# Patient Record
Sex: Female | Born: 1970 | Race: Black or African American | Hispanic: No | Marital: Single | Smoking: Never smoker
Health system: Southern US, Community
[De-identification: ages and names within clinical notes are randomized; demographics above are authoritative.]

## PROBLEM LIST (undated history)

## (undated) DIAGNOSIS — D573 Sickle-cell trait: Secondary | ICD-10-CM

## (undated) HISTORY — DX: Sickle-cell trait: D57.3

## (undated) HISTORY — PX: TYMPANOSTOMY TUBE PLACEMENT: SHX32

## (undated) HISTORY — PX: TUBAL LIGATION: SHX77

## (undated) HISTORY — PX: HYSTERECTOMY ABDOMINAL WITH SALPINGECTOMY: SHX6725

## (undated) HISTORY — PX: ADENOIDECTOMY: SUR15

## (undated) HISTORY — PX: SINOSCOPY: SHX187

---

## 2015-11-26 DIAGNOSIS — Z800 Family history of malignant neoplasm of digestive organs: Secondary | ICD-10-CM | POA: Insufficient documentation

## 2015-11-26 DIAGNOSIS — D509 Iron deficiency anemia, unspecified: Secondary | ICD-10-CM

## 2015-11-26 HISTORY — DX: Family history of malignant neoplasm of digestive organs: Z80.0

## 2015-11-26 HISTORY — DX: Iron deficiency anemia, unspecified: D50.9

## 2015-12-02 DIAGNOSIS — D259 Leiomyoma of uterus, unspecified: Secondary | ICD-10-CM

## 2015-12-02 DIAGNOSIS — I10 Essential (primary) hypertension: Secondary | ICD-10-CM

## 2015-12-02 HISTORY — DX: Essential (primary) hypertension: I10

## 2015-12-02 HISTORY — DX: Leiomyoma of uterus, unspecified: D25.9

## 2018-07-29 ENCOUNTER — Encounter: Payer: Self-pay | Admitting: Family Medicine

## 2018-07-29 ENCOUNTER — Ambulatory Visit
Admission: RE | Admit: 2018-07-29 | Discharge: 2018-07-29 | Disposition: A | Discharge status: Home / Self Care | Payer: BLUE CROSS/BLUE SHIELD | Source: Ambulatory Visit | Attending: Family Medicine | Admitting: Family Medicine

## 2018-07-29 ENCOUNTER — Ambulatory Visit (INDEPENDENT_AMBULATORY_CARE_PROVIDER_SITE_OTHER): Payer: BLUE CROSS/BLUE SHIELD | Admitting: Family Medicine

## 2018-07-29 VITALS — BP 130/90 | HR 72 | Temp 98.6°F | Wt 365.4 lb

## 2018-07-29 DIAGNOSIS — M25561 Pain in right knee: Secondary | ICD-10-CM

## 2018-07-29 DIAGNOSIS — Z7689 Persons encountering health services in other specified circumstances: Secondary | ICD-10-CM | POA: Diagnosis not present

## 2018-07-29 MED ORDER — MELOXICAM 7.5 MG PO TABS: 7.5000 mg | ORAL_TABLET | Freq: Every day | ORAL | 0 refills | Status: DC

## 2018-07-29 NOTE — Progress Notes (Signed)
   Subjective:    Patient ID: Cheryl Morrow, female    DOB: 03-Feb-1971, 47 y.o.   MRN: [id number]  HPI Chief Complaint  Patient presents with  . knee pain    right knee pain,- 2 weeks ago. worse in the morning and then will get better throughout the day   She is new to the practice and here for an acute complaint. Previous medical care: moved here from [place] in March.  Other providers: PCP in [place]- Dr. Willow Ora   Complains of a 2-week history of right knee pain. No known injury. No surgery or history of similar knee pain.  Pain is worse in the morning to anterior and lateral aspect and pain is also worse with standing and with stairs.  Pain is improved with rest, heat or ice, elevation.  Notices swelling worsens during the day and improves with rest.   She has been taking ibuprofen 600 mg 3 times per day for the past 2 weeks.   Reports feeling  like her knee gives away and occasionally feels like it "locks up".  States she has tingling occasionally in posterior knee.   Denies fever, chills, chest pain, shortness of breath, N/V/D. No numbness, weakness. No other arthralgias or myalgias.   Morbid obesity.  Single. One child age 42. Works in [employer]. 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    Review of Systems Pertinent positives and negatives in the history of present illness.     Objective:   Physical Exam  Constitutional: She appears well-developed and well-nourished. No distress.  Cardiovascular: Intact distal pulses.  No LE edema  Musculoskeletal:       Right knee: She exhibits swelling. She exhibits normal range of motion, no deformity, no erythema and normal alignment. Tenderness found. Lateral joint line tenderness noted.  Difficult to do full exam due to body habitus but no obvious McMurrays sign or instability.    Skin: Skin is warm and dry. Capillary refill takes less than 2 seconds. No rash noted.   BP 130/90   Pulse 72   Temp 98.6  F (37 C) (Oral)   Wt (!) 365 lb 6.4 oz (165.7 kg)       Assessment & Plan:  Acute pain of right knee - Plan: meloxicam (MOBIC) 7.5 MG tablet, DG Knee Complete 4 Views Right, Ambulatory referral to Orthopedic Surgery  Encounter to establish care  Right lower extremity is neurovascularly intact.  Discussed possible etiologies such as arthritis, meniscal tear.  No instability on exam.  Recommend supportive care.  She will stop ibuprofen and start meloxicam.  I also recommend continued use of ice or heat and elevate when sitting.  She may also try topical analgesia such as Capsaicin cream. X-ray ordered and referral to orthopedist

## 2018-07-29 NOTE — Patient Instructions (Signed)
Stop the ibuprofen and try Meloxicam with food once daily.   Use ice or heat, elevate your knee when sitting.  You may try over the counter topical pain medicine such as Capsaicin cream.   We will call with your XR result.   I am referring you to the orthopedist and you should receive a call to schedule an appointment.

## 2018-08-05 ENCOUNTER — Ambulatory Visit (INDEPENDENT_AMBULATORY_CARE_PROVIDER_SITE_OTHER): Payer: BLUE CROSS/BLUE SHIELD | Admitting: Orthopaedic Surgery

## 2018-08-05 ENCOUNTER — Encounter (INDEPENDENT_AMBULATORY_CARE_PROVIDER_SITE_OTHER): Payer: Self-pay | Admitting: Orthopaedic Surgery

## 2018-08-05 VITALS — Ht 68.0 in | Wt 364.0 lb

## 2018-08-05 DIAGNOSIS — Z6841 Body Mass Index (BMI) 40.0 and over, adult: Secondary | ICD-10-CM

## 2018-08-05 DIAGNOSIS — M1711 Unilateral primary osteoarthritis, right knee: Secondary | ICD-10-CM | POA: Diagnosis not present

## 2018-08-05 MED ORDER — LIDOCAINE HCL 1 % IJ SOLN
2.0000 mL | INTRAMUSCULAR | Status: AC | PRN
  Administered 2018-08-05: 2 mL

## 2018-08-05 MED ORDER — BUPIVACAINE HCL 0.5 % IJ SOLN
2.0000 mL | INTRAMUSCULAR | Status: AC | PRN
  Administered 2018-08-05: 2 mL via INTRA_ARTICULAR

## 2018-08-05 MED ORDER — METHYLPREDNISOLONE ACETATE 40 MG/ML IJ SUSP
40.0000 mg | INTRAMUSCULAR | Status: AC | PRN
  Administered 2018-08-05: 40 mg via INTRA_ARTICULAR

## 2018-08-05 NOTE — Progress Notes (Signed)
Office Visit Note   Patient: Cheryl Morrow           Date of Birth: [birth year]           MRN: [id number] Visit Date: 08/05/2018              Requested by: Girtha Rm, NP-C [place], [place] [address] PCP: Girtha Rm, NP-C   Assessment & Plan: Visit Diagnoses:  1. Unilateral primary osteoarthritis, right knee   2. Body mass index 50.0-59.9, adult ([place])   3. Morbid obesity ([place])     Plan: Impression is 47 year old female with age advanced tricompartment degenerative joint disease.  We discussed the importance of weight loss and exercise for relief of knee pain and arthritis.  Cortisone injection performed today.  I gave her a sample of Pennsaid and a pamphlet for Synvisc.  Referral to Dr. Leafy Ro for weight loss management.  Questions encouraged and answered.  Follow-up as needed. Total face to face encounter time was greater than 45 minutes and over half of this time was spent in counseling and/or coordination of care. The patient meets the AMA guidelines for Morbid (severe) obesity with a BMI > 40.0 and I have recommended weight loss.  Follow-Up Instructions: Return if symptoms worsen or fail to improve.   Orders:  Orders Placed This Encounter  Procedures  . Amb Ref to Medical Weight Management   No orders of the defined types were placed in this encounter.     Procedures: Large Joint Inj: R knee on 08/05/2018 10:17 AM Indications: pain Details: 22 G needle  Arthrogram: No  Medications: 40 mg methylPREDNISolone acetate 40 MG/ML; 2 mL lidocaine 1 %; 2 mL bupivacaine 0.5 % Outcome: tolerated well, no immediate complications Consent was given by the patient. Patient was prepped and draped in the usual sterile fashion.       Clinical Data: No additional findings.   Subjective: Chief Complaint  Patient presents with  . Right Knee - Pain    Cheryl Morrow is a healthy 47 year old female comes in with 1 month of right knee pain with  progressive worsening.  She denies any injuries.  She has been referred here by her PCP.  She denies any mechanical symptoms or numbness and tingling.  Her pain is 7 out of 10 and worse in the morning.   Review of Systems  Constitutional: Negative.   HENT: Negative.   Eyes: Negative.   Respiratory: Negative.   Cardiovascular: Negative.   Endocrine: Negative.   Musculoskeletal: Negative.   Neurological: Negative.   Hematological: Negative.   Psychiatric/Behavioral: Negative.   All other systems reviewed and are negative.    Objective: Vital Signs: Ht 5\' 8"  (1.727 m)   Wt (!) 364 lb (165.1 kg)   BMI 55.35 kg/m   Physical Exam  Constitutional: She is oriented to person, place, and time. She appears well-developed and well-nourished.  HENT:  Head: Normocephalic and atraumatic.  Eyes: EOM are normal.  Neck: Neck supple.  Pulmonary/Chest: Effort normal.  Abdominal: Soft.  Neurological: She is alert and oriented to person, place, and time.  Skin: Skin is warm. Capillary refill takes less than 2 seconds.  Psychiatric: She has a normal mood and affect. Her behavior is normal. Judgment and thought content normal.  Nursing note and vitals reviewed.   Ortho Exam Right knee exam shows no joint effusion although this was somewhat difficult due to her soft tissue envelope.  Collaterals and cruciates are grossly intact.  Normal  range of motion. Specialty Comments:  No specialty comments available.  Imaging: No results found.   PMFS History: There are no active problems to display for this patient.  History reviewed. No pertinent past medical history.  History reviewed. No pertinent family history.  Past Surgical History:  Procedure Laterality Date  . HYSTERECTOMY ABDOMINAL WITH SALPINGECTOMY     Social History   Occupational History  . Not on file  Tobacco Use  . Smoking status: Never Smoker  . Smokeless tobacco: Never Used  Substance and Sexual Activity  . Alcohol  use: Yes    Comment: occasionally  . Drug use: Never  . Sexual activity: Not on file

## 2018-08-09 ENCOUNTER — Telehealth (INDEPENDENT_AMBULATORY_CARE_PROVIDER_SITE_OTHER): Payer: Self-pay | Admitting: Orthopaedic Surgery

## 2018-08-09 NOTE — Telephone Encounter (Signed)
Please advise. Thanks.  

## 2018-08-09 NOTE — Telephone Encounter (Signed)
Tramadol #14.  1-2 tab daily prn pain.  It has only been 4 days since her injection

## 2018-08-09 NOTE — Telephone Encounter (Signed)
Patient called requesting for pain. States the cortisone shot didn't help. Nothing over the counter  Is helping her.  Please call patient to advise [phone]

## 2018-08-09 NOTE — Telephone Encounter (Signed)
See message below °

## 2018-08-10 ENCOUNTER — Other Ambulatory Visit (INDEPENDENT_AMBULATORY_CARE_PROVIDER_SITE_OTHER): Payer: Self-pay

## 2018-08-10 MED ORDER — TRAMADOL HCL 50 MG PO TABS: ORAL_TABLET | ORAL | 0 refills | Status: DC

## 2018-08-10 NOTE — Telephone Encounter (Signed)
Called patient to advise. Advised her Rx was called into her pharm.

## 2018-08-29 ENCOUNTER — Telehealth (INDEPENDENT_AMBULATORY_CARE_PROVIDER_SITE_OTHER): Payer: Self-pay

## 2018-08-29 NOTE — Telephone Encounter (Signed)
See message below  Please advise

## 2018-08-29 NOTE — Telephone Encounter (Signed)
The patient calls with 2 requests: #1 a knee brace - she said she has a large leg so she might need an Rx for this, #2 would like something for pain.  She has Tramadol from her last ov - only been taking 1 qd because she only received #14 and she's trying to make it last.  She also takes Meloxicam 7.5 mg (originally given by PCP), but only has a couple left.  She says the Tramadol is not helping her pain well at night (however, she has been taking it during the day to get her through her work day). The pain wakes her at night and she has pain when she first gets up in the morning.  [facility] on [address].  Please advise on the brace and pain medicine. CB [phone].

## 2018-09-01 ENCOUNTER — Other Ambulatory Visit (INDEPENDENT_AMBULATORY_CARE_PROVIDER_SITE_OTHER): Payer: Self-pay

## 2018-09-01 MED ORDER — TRAMADOL HCL 50 MG PO TABS: 50.0000 mg | ORAL_TABLET | Freq: Two times a day (BID) | ORAL | 0 refills | Status: DC

## 2018-09-01 NOTE — Telephone Encounter (Signed)
I don't think djd unloading brace would be appropriate as she has tricompartmental changes.  She can get knee sleeve over the counter if she would like.  Ok to take two tramadol per day and ok to refill this.  Cannot give anything stronger.  Best to make all efforts at weight loss

## 2018-09-01 NOTE — Telephone Encounter (Signed)
Tried to call patient no answer LMOM. Just need to advise on message below. Also called in tramadol into pharm.

## 2018-09-05 NOTE — Telephone Encounter (Signed)
Tried calling again, no answer. LMVM

## 2018-10-12 ENCOUNTER — Telehealth (INDEPENDENT_AMBULATORY_CARE_PROVIDER_SITE_OTHER): Payer: Self-pay | Admitting: Orthopaedic Surgery

## 2018-10-12 NOTE — Telephone Encounter (Signed)
We cannot prescribe opiate pain meds on a chronic basis.  I'm happy to give her a refill of tramadol if she wants that.

## 2018-10-12 NOTE — Telephone Encounter (Signed)
Please advise 

## 2018-10-12 NOTE — Telephone Encounter (Signed)
Patient called to request an RX refill on her hydrocodone.  CB#[phone].  Thank you.

## 2018-10-13 NOTE — Telephone Encounter (Signed)
Called patient no answer LMOM. See message below. If she does want Rx for Tramadol please ask what pharm she would like [place] to send it in

## 2018-10-14 NOTE — Telephone Encounter (Signed)
Called patient no answer.

## 2019-09-13 ENCOUNTER — Encounter: Payer: Self-pay | Admitting: Family Medicine

## 2019-09-13 ENCOUNTER — Other Ambulatory Visit: Payer: Self-pay

## 2019-09-13 ENCOUNTER — Ambulatory Visit (INDEPENDENT_AMBULATORY_CARE_PROVIDER_SITE_OTHER): Payer: BC Managed Care – PPO | Admitting: Family Medicine

## 2019-09-13 VITALS — BP 184/118 | Resp 16 | Wt 355.0 lb

## 2019-09-13 DIAGNOSIS — R05 Cough: Secondary | ICD-10-CM | POA: Diagnosis not present

## 2019-09-13 DIAGNOSIS — I10 Essential (primary) hypertension: Secondary | ICD-10-CM | POA: Diagnosis not present

## 2019-09-13 DIAGNOSIS — Z9109 Other allergy status, other than to drugs and biological substances: Secondary | ICD-10-CM

## 2019-09-13 DIAGNOSIS — R058 Other specified cough: Secondary | ICD-10-CM

## 2019-09-13 NOTE — Progress Notes (Signed)
   Subjective:  Documentation for virtual audio and video telecommunications through [facility] encounter:  The patient was located at work. 2 patient identifiers used.  The provider was located in the office. The patient did consent to this visit and is aware of possible charges through their insurance for this visit.  The other persons participating in this telemedicine service were none.    Patient ID: Cheryl Morrow, female    DOB: [birth year], 48 y.o.   MRN: [id number]  HPI Chief Complaint  Patient presents with  . cough and sob    cough and sob, been going on for a couple monthd   She is fairly new to me and has several new concerns today.  Complains of dry cough since she moved to [place] from [place] in March 2019. States her cough is daytime and nighttime. She thought her cough was related to allergies so she is taking a non drowsy antihistamine but no relief.  She also reports having itchy, watery eyes and post nasal drainage.   Denies fever, chills, headache, dizziness, chest pain, palpitations, abdominal pain, N/V/D.   She also reports elevated BP for the past few weeks with a history of HTN. She has been on medication in the distant past but not sure what she took.  States her BP today is 184/118.   States swelling in her feet has been intermittent and resolves every morning. She sits at work most of the day. No orthopnea.   States she has noticed some shortness of breath with activity and she thinks this is related to being deconditioned.   Reviewed allergies, medications, past medical, surgical, family, and social history  Review of Systems Pertinent positives and negatives in the history of present illness.     Objective:   Physical Exam BP (!) 184/118   Resp 16   Wt (!) 355 lb (161 kg)   BMI 53.98 kg/m   Alert and oriented and in no acute distress. Respirations unlabored. Normal speech, mood.       Assessment & Plan:  Cough present for greater than 3 weeks  Environmental allergies  Uncontrolled hypertension  Discussed limitations of virtual visit and that her symptoms really do require an in office evaluation. I am concerned that her BP is severely elevated. She reports shortness of breath with exertion and I advised her that this could be a cardiac issue. She denies any chest pain or palpitations.  Recommend that she come in for evaluation and she will schedule a visit.  Strict precautions that if she has any neurological issues, chest pain or worsening shortness of breath that she be seen immediately.  Discuss allergies and cough at her visit.  She will most likely need HTN medication based on her reported BP today.   Time spent on call was 14 minutes and in review of previous records 15 minutes total.  This virtual service is not related to other E/M service within previous 7 days.

## 2019-09-15 ENCOUNTER — Ambulatory Visit
Admission: RE | Admit: 2019-09-15 | Discharge: 2019-09-15 | Disposition: A | Discharge status: Home / Self Care | Payer: BC Managed Care – PPO | Source: Ambulatory Visit | Attending: Family Medicine | Admitting: Family Medicine

## 2019-09-15 ENCOUNTER — Encounter: Payer: Self-pay | Admitting: Family Medicine

## 2019-09-15 ENCOUNTER — Ambulatory Visit (INDEPENDENT_AMBULATORY_CARE_PROVIDER_SITE_OTHER): Payer: BC Managed Care – PPO | Admitting: Family Medicine

## 2019-09-15 ENCOUNTER — Other Ambulatory Visit: Payer: Self-pay

## 2019-09-15 VITALS — BP 190/110 | HR 80 | Temp 98.8°F | Resp 18 | Wt 365.4 lb

## 2019-09-15 DIAGNOSIS — Z13220 Encounter for screening for lipoid disorders: Secondary | ICD-10-CM

## 2019-09-15 DIAGNOSIS — R05 Cough: Secondary | ICD-10-CM | POA: Diagnosis not present

## 2019-09-15 DIAGNOSIS — R0602 Shortness of breath: Secondary | ICD-10-CM

## 2019-09-15 DIAGNOSIS — I517 Cardiomegaly: Secondary | ICD-10-CM

## 2019-09-15 DIAGNOSIS — R058 Other specified cough: Secondary | ICD-10-CM

## 2019-09-15 DIAGNOSIS — R9431 Abnormal electrocardiogram [ECG] [EKG]: Secondary | ICD-10-CM | POA: Insufficient documentation

## 2019-09-15 DIAGNOSIS — I10 Essential (primary) hypertension: Secondary | ICD-10-CM

## 2019-09-15 DIAGNOSIS — Z131 Encounter for screening for diabetes mellitus: Secondary | ICD-10-CM

## 2019-09-15 HISTORY — DX: Cardiomegaly: I51.7

## 2019-09-15 HISTORY — DX: Essential (primary) hypertension: I10

## 2019-09-15 HISTORY — DX: Abnormal electrocardiogram (ECG) (EKG): R94.31

## 2019-09-15 HISTORY — DX: Morbid (severe) obesity due to excess calories: E66.01

## 2019-09-15 LAB — POCT URINALYSIS DIP (PROADVANTAGE DEVICE)
Bilirubin, UA: NEGATIVE
Blood, UA: NEGATIVE
Glucose, UA: NEGATIVE mg/dL
Ketones, POC UA: NEGATIVE mg/dL
Leukocytes, UA: NEGATIVE
Nitrite, UA: NEGATIVE
Specific Gravity, Urine: 1.01
Urobilinogen, Ur: NEGATIVE
pH, UA: 7.5 (ref 5.0–8.0)

## 2019-09-15 MED ORDER — LISINOPRIL-HYDROCHLOROTHIAZIDE 20-12.5 MG PO TABS: 1.0000 | ORAL_TABLET | Freq: Every day | ORAL | 2 refills | Status: DC

## 2019-09-15 NOTE — Patient Instructions (Signed)
Go to [facility] for your chest X ray as discussed.   Start on the medication to lower your blood pressure.  This is once daily dosing.   Keep a close eye on your BP.   Goal BP is 130/80 or less and the goal is to gradually get you to this reading.   Cut back on sodium and a good diet to follow is listed below.   I would like for you to be evaluated by a cardiologist as discussed due to abnormal findings on your EKG today.   If you experience chest pain, palpitations, worsening shortness of breath or any other worrisome symptoms, do not wait but call 911 or go straight to the emergency department.   We will be in tough with your results from today.      DASH Eating Plan DASH stands for "Dietary Approaches to Stop Hypertension." The DASH eating plan is a healthy eating plan that has been shown to reduce high blood pressure (hypertension). It may also reduce your risk for type 2 diabetes, heart disease, and stroke. The DASH eating plan may also help with weight loss. What are tips for following this plan?  General guidelines  Avoid eating more than 2,300 mg (milligrams) of salt (sodium) a day. If you have hypertension, you may need to reduce your sodium intake to 1,500 mg a day.  Limit alcohol intake to no more than 1 drink a day for nonpregnant women and 2 drinks a day for men. One drink equals 12 oz of beer, 5 oz of wine, or 1 oz of hard liquor.  Work with your health care provider to maintain a healthy body weight or to lose weight. Ask what an ideal weight is for you.  Get at least 30 minutes of exercise that causes your heart to beat faster (aerobic exercise) most days of the week. Activities may include walking, swimming, or biking.  Work with your health care provider or diet and nutrition specialist (dietitian) to adjust your eating plan to your individual calorie needs. Reading food labels   Check food labels for the amount of sodium per serving. Choose foods with  less than 5 percent of the Daily Value of sodium. Generally, foods with less than 300 mg of sodium per serving fit into this eating plan.  To find whole grains, look for the word "whole" as the first word in the ingredient list. Shopping  Buy products labeled as "low-sodium" or "no salt added."  Buy fresh foods. Avoid canned foods and premade or frozen meals. Cooking  Avoid adding salt when cooking. Use salt-free seasonings or herbs instead of table salt or sea salt. Check with your health care provider or pharmacist before using salt substitutes.  Do not fry foods. Cook foods using healthy methods such as baking, boiling, grilling, and broiling instead.  Cook with heart-healthy oils, such as olive, canola, soybean, or sunflower oil. Meal planning  Eat a balanced diet that includes: ? 5 or more servings of fruits and vegetables each day. At each meal, try to fill half of your plate with fruits and vegetables. ? Up to 6-8 servings of whole grains each day. ? Less than 6 oz of lean meat, poultry, or fish each day. A 3-oz serving of meat is about the same size as a deck of cards. One egg equals 1 oz. ? 2 servings of low-fat dairy each day. ? A serving of nuts, seeds, or beans 5 times each week. ? Heart-healthy fats. Healthy  fats called Omega-3 fatty acids are found in foods such as flaxseeds and coldwater fish, like sardines, salmon, and mackerel.  Limit how much you eat of the following: ? Canned or prepackaged foods. ? Food that is high in trans fat, such as fried foods. ? Food that is high in saturated fat, such as fatty meat. ? Sweets, desserts, sugary drinks, and other foods with added sugar. ? Full-fat dairy products.  Do not salt foods before eating.  Try to eat at least 2 vegetarian meals each week.  Eat more home-cooked food and less restaurant, buffet, and fast food.  When eating at a restaurant, ask that your food be prepared with less salt or no salt, if possible. What  foods are recommended? The items listed may not be a complete list. Talk with your dietitian about what dietary choices are best for you. Grains Whole-grain or whole-wheat bread. Whole-grain or whole-wheat pasta. Brown rice. [place] Morrow. Bulgur. Whole-grain and low-sodium cereals. Pita bread. Low-fat, low-sodium crackers. Whole-wheat flour tortillas. Vegetables Fresh or frozen vegetables (raw, steamed, roasted, or grilled). Low-sodium or reduced-sodium tomato and vegetable juice. Low-sodium or reduced-sodium tomato sauce and tomato paste. Low-sodium or reduced-sodium canned vegetables. Fruits All fresh, dried, or frozen fruit. Canned fruit in natural juice (without added sugar). Meat and other protein foods Skinless chicken or [place]. Ground chicken or [place]. Pork with fat trimmed off. Fish and seafood. Egg whites. Dried beans, peas, or lentils. Unsalted nuts, nut butters, and seeds. Unsalted canned beans. Lean cuts of beef with fat trimmed off. Low-sodium, lean deli meat. Dairy Low-fat (1%) or fat-free (skim) milk. Fat-free, low-fat, or reduced-fat cheeses. Nonfat, low-sodium ricotta or cottage cheese. Low-fat or nonfat yogurt. Low-fat, low-sodium cheese. Fats and oils Soft margarine without trans fats. Vegetable oil. Low-fat, reduced-fat, or light mayonnaise and salad dressings (reduced-sodium). Canola, safflower, olive, soybean, and sunflower oils. Avocado. Seasoning and other foods Herbs. Spices. Seasoning mixes without salt. Unsalted popcorn and pretzels. Fat-free sweets. What foods are not recommended? The items listed may not be a complete list. Talk with your dietitian about what dietary choices are best for you. Grains Baked goods made with fat, such as croissants, muffins, or some breads. Dry pasta or rice meal packs. Vegetables Creamed or fried vegetables. Vegetables in a cheese sauce. Regular canned vegetables (not low-sodium or reduced-sodium). Regular canned tomato sauce and  paste (not low-sodium or reduced-sodium). Regular tomato and vegetable juice (not low-sodium or reduced-sodium). Angie Fava. Olives. Fruits Canned fruit in a light or heavy syrup. Fried fruit. Fruit in cream or butter sauce. Meat and other protein foods Fatty cuts of meat. Ribs. Fried meat. Berniece Salines. Sausage. Bologna and other processed lunch meats. Salami. Fatback. Hotdogs. Bratwurst. Salted nuts and seeds. Canned beans with added salt. Canned or smoked fish. Whole eggs or egg yolks. Chicken or [place] with skin. Dairy Whole or 2% milk, cream, and half-and-half. Whole or full-fat cream cheese. Whole-fat or sweetened yogurt. Full-fat cheese. Nondairy creamers. Whipped toppings. Processed cheese and cheese spreads. Fats and oils Butter. Stick margarine. Lard. Shortening. Ghee. Bacon fat. Tropical oils, such as coconut, palm kernel, or palm oil. Seasoning and other foods Salted popcorn and pretzels. Onion salt, garlic salt, seasoned salt, table salt, and sea salt. Worcestershire sauce. Tartar sauce. Barbecue sauce. Teriyaki sauce. Soy sauce, including reduced-sodium. Steak sauce. Canned and packaged gravies. Fish sauce. Oyster sauce. Cocktail sauce. Horseradish that you find on the shelf. Ketchup. Mustard. Meat flavorings and tenderizers. Bouillon cubes. Hot sauce and Tabasco sauce. Premade or packaged  marinades. Premade or packaged taco seasonings. Relishes. Regular salad dressings. Where to find more information:  National Heart, Lung, and [place]: https://wilson-eaton.com/  American Heart Association: www.heart.org Summary  The DASH eating plan is a healthy eating plan that has been shown to reduce high blood pressure (hypertension). It may also reduce your risk for type 2 diabetes, heart disease, and stroke.  With the DASH eating plan, you should limit salt (sodium) intake to 2,300 mg a day. If you have hypertension, you may need to reduce your sodium intake to 1,500 mg a day.  When on the DASH  eating plan, aim to eat more fresh fruits and vegetables, whole grains, lean proteins, low-fat dairy, and heart-healthy fats.  Work with your health care provider or diet and nutrition specialist (dietitian) to adjust your eating plan to your individual calorie needs. This information is not intended to replace advice given to you by your health care provider. Make sure you discuss any questions you have with your health care provider. Document Released: 10/08/2011 Document Revised: 10/01/2017 Document Reviewed: 10/12/2016 Elsevier Patient Education  2020 [employer].

## 2019-09-15 NOTE — Progress Notes (Signed)
Subjective:    Patient ID: Cheryl Morrow, female    DOB: 1971-03-28, 48 y.o.   MRN: [id number]  HPI Chief Complaint  Patient presents with  . 1 week follow-up    1 week follow-up on bp, 182/ 102   Here with concerns related to elevated BP, chronic cough and shortness of breath that she thinks is related to her coughing spells.   Reports history of HTN and was on medication for BP from 2017 to 2018. No medication since 2018.   States she had a take medication to lower her blood pressure before she could have her hysterectomy which was in 2018.  States when her refills ran out she stopped taking medication and did not follow-up with her provider. She recently started checking her blood pressure and has seen at gradually increase  Denies chest pain, palpitations, orthopnea.  Reports intermittent lower extremity edema.  Denies having any swelling today.  Complains of dry cough since she moved to [place] from [place] in March 2019. States her cough is daytime and nighttime. She thought her cough was related to allergies so she is taking a non drowsy antihistamine but no relief.   She does not smoke. Denies family history of heart disease.  Denies fever, chills, headache, dizziness, unexplained weight changes , vision changes, abdominal pain, N/V/D, urinary symptoms.   Reviewed allergies, medications, past medical, surgical, family, and social history.    Review of Systems Pertinent positives and negatives in the history of present illness.     Objective:   Physical Exam Constitutional:      General: She is not in acute distress.    Appearance: She is obese. She is not ill-appearing.  Eyes:     Extraocular Movements: Extraocular movements intact.     Conjunctiva/sclera: Conjunctivae normal.     Pupils: Pupils are equal, round, and reactive to light.  Neck:     Musculoskeletal: Normal range of motion and neck supple.  Cardiovascular:     Rate and Rhythm: Normal rate and regular  rhythm.     Pulses: Normal pulses.     Heart sounds: Normal heart sounds.  Pulmonary:     Effort: Pulmonary effort is normal.     Comments: Difficulty to assess lung sounds in lower fields due to body habitus.  Musculoskeletal:     Right lower leg: No edema.     Left lower leg: No edema.  Skin:    General: Skin is warm and dry.     Capillary Refill: Capillary refill takes less than 2 seconds.  Neurological:     General: No focal deficit present.     Mental Status: She is alert and oriented to person, place, and time.     Cranial Nerves: No cranial nerve deficit.     Sensory: No sensory deficit.     Deep Tendon Reflexes: Reflexes normal.  Psychiatric:        Mood and Affect: Mood normal.        Thought Content: Thought content normal.    BP (!) 190/110   Pulse 80   Temp 98.8 F (37.1 C)   Resp 18   Wt (!) 365 lb 6.4 oz (165.7 kg)   SpO2 96%   BMI 55.56 kg/m       Assessment & Plan:  Uncontrolled hypertension - Plan: CBC with Differential, Comprehensive metabolic panel, POCT Urinalysis DIP (Proadvantage Device), Lipid Panel, EKG 12-Lead, DG Chest 2 View, Brain natriuretic peptide, Ambulatory referral to Cardiology, lisinopril-hydrochlorothiazide (  ZESTORETIC) 20-12.5 MG tablet  Cough present for greater than 3 weeks - Plan: CBC with Differential, Comprehensive metabolic panel, Brain natriuretic peptide  Abnormal ECG - Plan: Brain natriuretic peptide, Ambulatory referral to Cardiology  Shortness of breath - Plan: EKG 12-Lead, DG Chest 2 View, Brain natriuretic peptide, Ambulatory referral to Cardiology  Morbid obesity ([place]) - Plan: T4, Free, TSH, Lipid Panel  Screening for diabetes mellitus - Plan: Hemoglobin A1c  Screening for lipid disorders - Plan: Lipid Panel  She is not in any acute distress. ECG shows NSR, rate 76, poor R wave progression, T wave abnormalities with no acute S-T changes.  No old ECGs for comparison.  Discussed risk factors for heart disease  including uncontrolled HTN, morbid obesity. I am screening her for diabetes and lipid disorders today. Denies family history of heart disease.  Urinalysis shows a trace of protein otherwise negative. She will go to [facility] for a chest x-ray. Start on lisinopril/HCTZ and continue keeping a close eye on her blood pressure. Encouraged a low-sodium diet and Dash diet handout was given to her. Referral to cardiology and she has an appointment scheduled for next Tuesday. Strict precautions that if she develops chest pain, palpitations or worsening shortness of breath that she will call 911 or go to the closest emergency department.

## 2019-09-16 LAB — CBC WITH DIFFERENTIAL/PLATELET
Basophils Absolute: 0.1 10*3/uL (ref 0.0–0.2)
Basos: 1 %
EOS (ABSOLUTE): 0 10*3/uL (ref 0.0–0.4)
Eos: 1 %
Hematocrit: 40.5 % (ref 34.0–46.6)
Hemoglobin: 13.2 g/dL (ref 11.1–15.9)
Immature Grans (Abs): 0 10*3/uL (ref 0.0–0.1)
Immature Granulocytes: 0 %
Lymphocytes Absolute: 1.8 10*3/uL (ref 0.7–3.1)
Lymphs: 36 %
MCH: 27.5 pg (ref 26.6–33.0)
MCHC: 32.6 g/dL (ref 31.5–35.7)
MCV: 84 fL (ref 79–97)
Monocytes Absolute: 0.5 10*3/uL (ref 0.1–0.9)
Monocytes: 10 %
Neutrophils Absolute: 2.6 10*3/uL (ref 1.4–7.0)
Neutrophils: 52 %
Platelets: 204 10*3/uL (ref 150–450)
RBC: 4.8 x10E6/uL (ref 3.77–5.28)
RDW: 13.1 % (ref 11.7–15.4)
WBC: 5 10*3/uL (ref 3.4–10.8)

## 2019-09-16 LAB — COMPREHENSIVE METABOLIC PANEL
ALT: 16 IU/L (ref 0–32)
AST: 20 IU/L (ref 0–40)
Albumin/Globulin Ratio: 1.1 — ABNORMAL LOW (ref 1.2–2.2)
Albumin: 4.1 g/dL (ref 3.8–4.8)
Alkaline Phosphatase: 102 IU/L (ref 39–117)
BUN/Creatinine Ratio: 10 (ref 9–23)
BUN: 11 mg/dL (ref 6–24)
Bilirubin Total: 0.4 mg/dL (ref 0.0–1.2)
CO2: 24 mmol/L (ref 20–29)
Calcium: 9.2 mg/dL (ref 8.7–10.2)
Chloride: 102 mmol/L (ref 96–106)
Creatinine, Ser: 1.1 mg/dL — ABNORMAL HIGH (ref 0.57–1.00)
GFR calc Af Amer: 70 mL/min/{1.73_m2} (ref >59)
GFR calc non Af Amer: 60 mL/min/{1.73_m2} (ref >59)
Globulin, Total: 3.7 g/dL (ref 1.5–4.5)
Glucose: 90 mg/dL (ref 65–99)
Potassium: 3.3 mmol/L — ABNORMAL LOW (ref 3.5–5.2)
Sodium: 140 mmol/L (ref 134–144)
Total Protein: 7.8 g/dL (ref 6.0–8.5)

## 2019-09-16 LAB — HEMOGLOBIN A1C
Est. average glucose Bld gHb Est-mCnc: 117 mg/dL
Hgb A1c MFr Bld: 5.7 % — ABNORMAL HIGH (ref 4.8–5.6)

## 2019-09-16 LAB — LIPID PANEL
Chol/HDL Ratio: 3.9 ratio (ref 0.0–4.4)
Cholesterol, Total: 200 mg/dL — ABNORMAL HIGH (ref 100–199)
HDL: 51 mg/dL (ref >39)
LDL Chol Calc (NIH): 131 mg/dL — ABNORMAL HIGH (ref 0–99)
Triglycerides: 98 mg/dL (ref 0–149)
VLDL Cholesterol Cal: 18 mg/dL (ref 5–40)

## 2019-09-16 LAB — BRAIN NATRIURETIC PEPTIDE: BNP: 54.7 pg/mL (ref 0.0–100.0)

## 2019-09-16 LAB — TSH: TSH: 0.644 u[IU]/mL (ref 0.450–4.500)

## 2019-09-16 LAB — T4, FREE: Free T4: 1.9 ng/dL — ABNORMAL HIGH (ref 0.82–1.77)

## 2019-09-17 ENCOUNTER — Encounter: Payer: Self-pay | Admitting: Family Medicine

## 2019-09-17 ENCOUNTER — Other Ambulatory Visit: Payer: Self-pay | Admitting: Family Medicine

## 2019-09-17 DIAGNOSIS — E876 Hypokalemia: Secondary | ICD-10-CM

## 2019-09-17 DIAGNOSIS — R7303 Prediabetes: Secondary | ICD-10-CM | POA: Insufficient documentation

## 2019-09-17 HISTORY — DX: Prediabetes: R73.03

## 2019-09-17 HISTORY — DX: Hypokalemia: E87.6

## 2019-09-17 MED ORDER — POTASSIUM CHLORIDE CRYS ER 20 MEQ PO TBCR: 20.0000 meq | EXTENDED_RELEASE_TABLET | Freq: Every day | ORAL | 0 refills | Status: DC

## 2019-09-18 ENCOUNTER — Ambulatory Visit: Payer: BC Managed Care – PPO | Admitting: Family Medicine

## 2019-09-19 ENCOUNTER — Encounter: Payer: Self-pay | Admitting: Cardiology

## 2019-09-19 ENCOUNTER — Other Ambulatory Visit: Payer: Self-pay

## 2019-09-19 ENCOUNTER — Ambulatory Visit (INDEPENDENT_AMBULATORY_CARE_PROVIDER_SITE_OTHER): Payer: BC Managed Care – PPO | Admitting: Cardiology

## 2019-09-19 VITALS — BP 148/90 | HR 93 | Ht 68.0 in | Wt 371.0 lb

## 2019-09-19 DIAGNOSIS — R079 Chest pain, unspecified: Secondary | ICD-10-CM

## 2019-09-19 DIAGNOSIS — E876 Hypokalemia: Secondary | ICD-10-CM

## 2019-09-19 DIAGNOSIS — R011 Cardiac murmur, unspecified: Secondary | ICD-10-CM

## 2019-09-19 DIAGNOSIS — R0789 Other chest pain: Secondary | ICD-10-CM

## 2019-09-19 DIAGNOSIS — R7303 Prediabetes: Secondary | ICD-10-CM | POA: Diagnosis not present

## 2019-09-19 DIAGNOSIS — I10 Essential (primary) hypertension: Secondary | ICD-10-CM | POA: Diagnosis not present

## 2019-09-19 HISTORY — DX: Other chest pain: R07.89

## 2019-09-19 HISTORY — DX: Cardiac murmur, unspecified: R01.1

## 2019-09-19 MED ORDER — AMLODIPINE BESYLATE 5 MG PO TABS: 5.0000 mg | ORAL_TABLET | Freq: Every day | ORAL | 3 refills | Status: DC

## 2019-09-19 NOTE — Progress Notes (Signed)
Cardiology Office Note:    Date:  09/19/2019   ID:  Cheryl Morrow, DOB September 22, 1971, MRN [id number]  PCP:  Girtha Rm, NP-C  Cardiologist:  Jenean Lindau, MD   Referring MD: Girtha Rm, NP-C    ASSESSMENT:    1. Chest discomfort   2. Chest pain, unspecified type   3. Uncontrolled hypertension   4. Prediabetes   5. Morbid obesity ([place])   6. Hypokalemia   7. Cardiac murmur    PLAN:    In order of problems listed above:  1. Chest discomfort: Patient has risk factors for coronary artery disease.  Primary prevention stressed.  Importance of compliance with diet and medication stressed and she vocalized understanding.  I told her to avoid strenuous activity.  She will undergo 2-day Lexiscan sestamibi to assess the symptoms.  In the interim if she has any significant symptoms she knows to go to the nearest emergency room. 2. Essential hypertension: Diet was discussed.  I have added amlodipine 5 mg daily to her regimen.  She will keep a track of her blood pressures at work. 3. Morbid obesity: Diet was discussed and risks of obesity explained and she vocalized understanding and she promises to better 4. Patient will be seen in follow-up appointment in 6 months or earlier if the patient has any concerns    Medication Adjustments/Labs and Tests Ordered: Current medicines are reviewed at length with the patient today.  Concerns regarding medicines are outlined above.  Orders Placed This Encounter  Procedures  . Basic Metabolic Panel (BMET)  . Magnesium  . Myocardial Perfusion Imaging  . ECHOCARDIOGRAM COMPLETE   Meds ordered this encounter  Medications  . amLODipine (NORVASC) 5 MG tablet    Sig: Take 1 tablet (5 mg total) by mouth daily.    Dispense:  30 tablet    Refill:  3     History of Present Illness:    Cheryl Morrow is a 48 y.o. female who is being seen today for the evaluation of essential hypertension and chest discomfort at the request of Henson,  Vickie L, NP-C.  Patient is a pleasant 48 year old female.  She has no significant past medical history.  She is morbidly obese.  She mentions to me that recently she was diagnosed to have essential hypertension and medications were instituted and her blood pressure is better.  Chest x-ray was abnormal and revealed the possibility of an enlarged heart therefore she was sent here for evaluation.  She mentions to me that she leads a sedentary lifestyle.  She does not exercise on a regular basis.  She is active at work.  She occasionally has chest discomfort.  I checked with her with her sexual activity brings about the symptoms and she answered in the positive.  She has a chest pressure-like sensation during sexual activity and at times it has made her stop.  No orthopnea or PND.  No radiation to the neck or to the arms.  At the time of my evaluation, the patient is alert awake oriented and in no distress.  Past Medical History:  Diagnosis Date  . EKG abnormalities 09/15/2019  . Hypokalemia 09/17/2019  . Mild cardiomegaly 09/15/2019  . Morbid obesity ([place]) 09/15/2019  . Prediabetes 09/17/2019  . Uncontrolled hypertension 09/15/2019    Past Surgical History:  Procedure Laterality Date  . HYSTERECTOMY ABDOMINAL WITH SALPINGECTOMY      Current Medications: Current Meds  Medication Sig  . lisinopril-hydrochlorothiazide (ZESTORETIC) 20-12.5 MG tablet Take 1  tablet by mouth daily.  . potassium chloride SA (KLOR-CON) 20 MEQ tablet Take 1 tablet (20 mEq total) by mouth daily.     Allergies:   Patient has no known allergies.   Social History   Socioeconomic History  . Marital status: Single    Spouse name: Not on file  . Number of children: Not on file  . Years of education: Not on file  . Highest education level: Not on file  Occupational History  . Not on file  Social Needs  . Financial resource strain: Not on file  . Food insecurity    Worry: Not on file    Inability: Not on file  .  Transportation needs    Medical: Not on file    Non-medical: Not on file  Tobacco Use  . Smoking status: Never Smoker  . Smokeless tobacco: Never Used  Substance and Sexual Activity  . Alcohol use: Yes    Comment: occasionally  . Drug use: Never  . Sexual activity: Not on file  Lifestyle  . Physical activity    Days per week: Not on file    Minutes per session: Not on file  . Stress: Not on file  Relationships  . Social Herbalist on phone: Not on file    Gets together: Not on file    Attends religious service: Not on file    Active member of club or organization: Not on file    Attends meetings of clubs or organizations: Not on file    Relationship status: Not on file  Other Topics Concern  . Not on file  Social History Narrative  . Not on file     Family History: The patient's family history is not on file.  ROS:   Please see the history of present illness.    All other systems reviewed and are negative.  EKGs/Labs/Other Studies Reviewed:    The following studies were reviewed today: EKG reveals sinus rhythm LVH and nonspecific ST-T changes.   Recent Labs: 09/15/2019: ALT 16; BNP 54.7; BUN 11; Creatinine, Ser 1.10; Hemoglobin 13.2; Platelets 204; Potassium 3.3; Sodium 140; TSH 0.644  Recent Lipid Panel    Component Value Date/Time   CHOL 200 (H) 09/15/2019 1015   TRIG 98 09/15/2019 1015   HDL 51 09/15/2019 1015   CHOLHDL 3.9 09/15/2019 1015   LDLCALC 131 (H) 09/15/2019 1015    Physical Exam:    VS:  BP (!) 148/90   Pulse 93   Ht 5\' 8"  (1.727 m)   Wt (!) 371 lb (168.3 kg)   SpO2 96%   BMI 56.41 kg/m     Wt Readings from Last 3 Encounters:  09/19/19 (!) 371 lb (168.3 kg)  09/15/19 (!) 365 lb 6.4 oz (165.7 kg)  09/13/19 (!) 355 lb (161 kg)     GEN: Patient is in no acute distress HEENT: Normal NECK: No JVD; No carotid bruits LYMPHATICS: No lymphadenopathy CARDIAC: S1 S2 regular, 2/6 systolic murmur at the apex. RESPIRATORY:  Clear  to auscultation without rales, wheezing or rhonchi  ABDOMEN: Soft, non-tender, non-distended MUSCULOSKELETAL:  No edema; No deformity  SKIN: Warm and dry NEUROLOGIC:  Alert and oriented x 3 PSYCHIATRIC:  Normal affect    Signed, Jenean Lindau, MD  09/19/2019 3:16 PM    [place]

## 2019-09-19 NOTE — Patient Instructions (Addendum)
Medication Instructions:    Your physician has recommended you make the following change in your medication: START taking amlodipine 5 mg (1 tablet ) once daily  If you need a refill on your cardiac medications before your next appointment, please call your pharmacy.   Lab work: NONE If you have labs (blood work) drawn today and your tests are completely normal, you will receive your results only by: Marland Kitchen MyChart Message (if you have MyChart) OR . A paper copy in the mail If you have any lab test that is abnormal or we need to change your treatment, we will call you to review the results.  Testing/Procedures: Your physician has requested that you have an echocardiogram. Echocardiography is a painless test that uses sound waves to create images of your heart. It provides your doctor with information about the size and shape of your heart and how well your heart's chambers and valves are working. This procedure takes approximately one hour. There are no restrictions for this procedure.  Your physician has requested that you have a lexiscan myoview. For further information please visit [email or web address]. Please follow instruction sheet, as given.    Follow-Up: At [facility], you and your health needs are our priority.  As part of our continuing mission to provide you with exceptional heart care, we have created designated Provider Care Teams.  These Care Teams include your primary Cardiologist (physician) and Advanced Practice Providers (APPs -  Physician Assistants and Nurse Practitioners) who all work together to provide you with the care you need, when you need it. You will need a follow up appointment in 3 months.   Any Other Special Instructions Will Be Listed Below  Regadenoson injection What is this medicine? REGADENOSON is used to test the heart for coronary artery disease. It is used in patients who can not exercise for their stress test. This medicine may be used for other  purposes; ask your health care provider or pharmacist if you have questions. COMMON BRAND NAME(S): Lexiscan What should I tell my health care provider before I take this medicine? They need to know if you have any of these conditions:  heart problems  lung or breathing disease, like asthma or COPD  an unusual or allergic reaction to regadenoson, other medicines, foods, dyes, or preservatives  pregnant or trying to get pregnant  breast-feeding How should I use this medicine? This medicine is for injection into a vein. It is given by a health care professional in a hospital or clinic setting. Talk to your pediatrician regarding the use of this medicine in children. Special care may be needed. Overdosage: If you think you have taken too much of this medicine contact a poison control center or emergency room at once. NOTE: This medicine is only for you. Do not share this medicine with others. What if I miss a dose? This does not apply. What may interact with this medicine?  caffeine  dipyridamole  guarana  theophylline This list may not describe all possible interactions. Give your health care provider a list of all the medicines, herbs, non-prescription drugs, or dietary supplements you use. Also tell them if you smoke, drink alcohol, or use illegal drugs. Some items may interact with your medicine. What should I watch for while using this medicine? Your condition will be monitored carefully while you are receiving this medicine. Do not take medicines, foods, or drinks with caffeine (like coffee, tea, or colas) for at least 12 hours before your test. If you do  not know if something contains caffeine, ask your health care professional. What side effects may I notice from receiving this medicine? Side effects that you should report to your doctor or health care professional as soon as possible:  allergic reactions like skin rash, itching or hives, swelling of the face, lips, or tongue   breathing problems  chest pain, tightness or palpitations  severe headache Side effects that usually do not require medical attention (report to your doctor or health care professional if they continue or are bothersome):  flushing  headache  irritation or pain at site where injected  nausea, vomiting This list may not describe all possible side effects. Call your doctor for medical advice about side effects. You may report side effects to FDA at 1-800-FDA-1088. Where should I keep my medicine? This drug is given in a hospital or clinic and will not be stored at home. NOTE: This sheet is a summary. It may not cover all possible information. If you have questions about this medicine, talk to your doctor, pharmacist, or health care provider.  2020 Elsevier/Gold Standard (2008-06-18 15:08:13)  Cardiac Nuclear Scan A cardiac nuclear scan is a test that is done to check the flow of blood to your heart. It is done when you are resting and when you are exercising. The test looks for problems such as:  Not enough blood reaching a portion of the heart.  The heart muscle not working as it should. You may need this test if:  You have heart disease.  You have had lab results that are not normal.  You have had heart surgery or a balloon procedure to open up blocked arteries (angioplasty).  You have chest pain.  You have shortness of breath. In this test, a special dye (tracer) is put into your bloodstream. The tracer will travel to your heart. A camera will then take pictures of your heart to see how the tracer moves through your heart. This test is usually done at a hospital and takes 2-4 hours. Tell a doctor about:  Any allergies you have.  All medicines you are taking, including vitamins, herbs, eye drops, creams, and over-the-counter medicines.  Any problems you or family members have had with anesthetic medicines.  Any blood disorders you have.  Any surgeries you have had.   Any medical conditions you have.  Whether you are pregnant or may be pregnant. What are the risks? Generally, this is a safe test. However, problems may occur, such as:  Serious chest pain and heart attack. This is only a risk if the stress portion of the test is done.  Rapid heartbeat.  A feeling of warmth in your chest. This feeling usually does not last long.  Allergic reaction to the tracer. What happens before the test?  Ask your doctor about changing or stopping your normal medicines. This is important.  Follow instructions from your doctor about what you cannot eat or drink.  Remove your jewelry on the day of the test. What happens during the test?  An IV tube will be inserted into one of your veins.  Your doctor will give you a small amount of tracer through the IV tube.  You will wait for 20-40 minutes while the tracer moves through your bloodstream.  Your heart will be monitored with an electrocardiogram (ECG).  You will lie down on an exam table.  Pictures of your heart will be taken for about 15-20 minutes.  You may also have a stress test. For  this test, one of these things may be done: ? You will be asked to exercise on a treadmill or a stationary bike. ? You will be given medicines that will make your heart work harder. This is done if you are unable to exercise.  When blood flow to your heart has peaked, a tracer will again be given through the IV tube.  After 20-40 minutes, you will get back on the exam table. More pictures will be taken of your heart.  Depending on the tracer that is used, more pictures may need to be taken 3-4 hours later.  Your IV tube will be removed when the test is over. The test may vary among doctors and hospitals. What happens after the test?  Ask your doctor: ? Whether you can return to your normal schedule, including diet, activities, and medicines. ? Whether you should drink more fluids. This will help to remove the  tracer from your body. Drink enough fluid to keep your pee (urine) pale yellow.  Ask your doctor, or the department that is doing the test: ? When will my results be ready? ? How will I get my results? Summary  A cardiac nuclear scan is a test that is done to check the flow of blood to your heart.  Tell your doctor whether you are pregnant or may be pregnant.  Before the test, ask your doctor about changing or stopping your normal medicines. This is important.  Ask your doctor whether you can return to your normal activities. You may be asked to drink more fluids. This information is not intended to replace advice given to you by your health care provider. Make sure you discuss any questions you have with your health care provider. Document Released: 04/04/2018 Document Revised: 02/08/2019 Document Reviewed: 04/04/2018 Elsevier Patient Education  [place].  Echocardiogram An echocardiogram is a procedure that uses painless sound waves (ultrasound) to produce an image of the heart. Images from an echocardiogram can provide important information about:  Signs of coronary artery disease (CAD).  Aneurysm detection. An aneurysm is a weak or damaged part of an artery wall that bulges out from the normal force of blood pumping through the body.  Heart size and shape. Changes in the size or shape of the heart can be associated with certain conditions, including heart failure, aneurysm, and CAD.  Heart muscle function.  Heart valve function.  Signs of a past heart attack.  Fluid buildup around the heart.  Thickening of the heart muscle.  A tumor or infectious growth around the heart valves. Tell a health care provider about:  Any allergies you have.  All medicines you are taking, including vitamins, herbs, eye drops, creams, and over-the-counter medicines.  Any blood disorders you have.  Any surgeries you have had.  Any medical conditions you have.  Whether you are  pregnant or may be pregnant. What are the risks? Generally, this is a safe procedure. However, problems may occur, including:  Allergic reaction to dye (contrast) that may be used during the procedure. What happens before the procedure? No specific preparation is needed. You may eat and drink normally. What happens during the procedure?   An IV tube may be inserted into one of your veins.  You may receive contrast through this tube. A contrast is an injection that improves the quality of the pictures from your heart.  A gel will be applied to your chest.  A wand-like tool (transducer) will be moved over your chest.  The gel will help to transmit the sound waves from the transducer.  The sound waves will harmlessly bounce off of your heart to allow the heart images to be captured in real-time motion. The images will be recorded on a computer. The procedure may vary among health care providers and hospitals. What happens after the procedure?  You may return to your normal, everyday life, including diet, activities, and medicines, unless your health care provider tells you not to do that. Summary  An echocardiogram is a procedure that uses painless sound waves (ultrasound) to produce an image of the heart.  Images from an echocardiogram can provide important information about the size and shape of your heart, heart muscle function, heart valve function, and fluid buildup around your heart.  You do not need to do anything to prepare before this procedure. You may eat and drink normally.  After the echocardiogram is completed, you may return to your normal, everyday life, unless your health care provider tells you not to do that. This information is not intended to replace advice given to you by your health care provider. Make sure you discuss any questions you have with your health care provider. Document Released: 10/16/2000 Document Revised: 02/09/2019 Document Reviewed: 11/21/2016  Elsevier Patient Education  [place].  Amlodipine tablets What is this medicine? AMLODIPINE (am LOE di peen) is a calcium-channel blocker. It affects the amount of calcium found in your heart and muscle cells. This relaxes your blood vessels, which can reduce the amount of work the heart has to do. This medicine is used to lower high blood pressure. It is also used to prevent chest pain. This medicine may be used for other purposes; ask your health care provider or pharmacist if you have questions. COMMON BRAND NAME(S): Norvasc What should I tell my health care provider before I take this medicine? They need to know if you have any of these conditions:  heart disease  liver disease  an unusual or allergic reaction to amlodipine, other medicines, foods, dyes, or preservatives  pregnant or trying to get pregnant  breast-feeding How should I use this medicine? Take this medicine by mouth with a glass of water. Follow the directions on the prescription label. You can take it with or without food. If it upsets your stomach, take it with food. Take your medicine at regular intervals. Do not take it more often than directed. Do not stop taking except on your doctor's advice. Talk to your pediatrician regarding the use of this medicine in children. While this drug may be prescribed for children as young as 6 years for selected conditions, precautions do apply. Patients over 31 years of age may have a stronger reaction and need a smaller dose. Overdosage: If you think you have taken too much of this medicine contact a poison control center or emergency room at once. NOTE: This medicine is only for you. Do not share this medicine with others. What if I miss a dose? If you miss a dose, take it as soon as you can. If it is almost time for your next dose, take only that dose. Do not take double or extra doses. What may interact with this medicine? Do not take this medicine with any of the  following medications:  tranylcypromine This medicine may also interact with the following medications:  clarithromycin  cyclosporine  diltiazem  itraconazole  simvastatin  tacrolimus This list may not describe all possible interactions. Give your health care provider a list  of all the medicines, herbs, non-prescription drugs, or dietary supplements you use. Also tell them if you smoke, drink alcohol, or use illegal drugs. Some items may interact with your medicine. What should I watch for while using this medicine? Visit your healthcare professional for regular checks on your progress. Check your blood pressure as directed. Ask your healthcare professional what your blood pressure should be and when you should contact him or her. Do not treat yourself for coughs, colds, or pain while you are using this medicine without asking your healthcare professional for advice. Some medicines may increase your blood pressure. You may get dizzy. Do not drive, use machinery, or do anything that needs mental alertness until you know how this medicine affects you. Do not stand or sit up quickly, especially if you are an older patient. This reduces the risk of dizzy or fainting spells. Avoid alcoholic drinks; they can make you dizzier. What side effects may I notice from receiving this medicine? Side effects that you should report to your doctor or health care professional as soon as possible:  allergic reactions like skin rash, itching or hives; swelling of the face, lips, or tongue  fast, irregular heartbeat  signs and symptoms of low blood pressure like dizziness; feeling faint or lightheaded, falls; unusually weak or tired  swelling of ankles, feet, hands Side effects that usually do not require medical attention (report these to your doctor or health care professional if they continue or are bothersome):  dry mouth  facial flushing  headache  stomach pain  tiredness This list may not  describe all possible side effects. Call your doctor for medical advice about side effects. You may report side effects to FDA at 1-800-FDA-1088. Where should I keep my medicine? Keep out of the reach of children. Store at room temperature between 59 and 86 degrees F (15 and 30 degrees C). Throw away any unused medicine after the expiration date. NOTE: This sheet is a summary. It may not cover all possible information. If you have questions about this medicine, talk to your doctor, pharmacist, or health care provider.  2020 Elsevier/Gold Standard (2018-05-13 15:07:10)

## 2019-09-20 LAB — BASIC METABOLIC PANEL
BUN/Creatinine Ratio: 13 (ref 9–23)
BUN: 15 mg/dL (ref 6–24)
CO2: 25 mmol/L (ref 20–29)
Calcium: 9.1 mg/dL (ref 8.7–10.2)
Chloride: 100 mmol/L (ref 96–106)
Creatinine, Ser: 1.13 mg/dL — ABNORMAL HIGH (ref 0.57–1.00)
GFR calc Af Amer: 67 mL/min/{1.73_m2} (ref >59)
GFR calc non Af Amer: 58 mL/min/{1.73_m2} — ABNORMAL LOW (ref >59)
Glucose: 90 mg/dL (ref 65–99)
Potassium: 3.8 mmol/L (ref 3.5–5.2)
Sodium: 140 mmol/L (ref 134–144)

## 2019-09-20 LAB — MAGNESIUM: Magnesium: 2.2 mg/dL (ref 1.6–2.3)

## 2019-09-21 ENCOUNTER — Encounter: Payer: Self-pay | Admitting: Family Medicine

## 2019-09-21 ENCOUNTER — Ambulatory Visit (INDEPENDENT_AMBULATORY_CARE_PROVIDER_SITE_OTHER): Payer: BC Managed Care – PPO | Admitting: Family Medicine

## 2019-09-21 ENCOUNTER — Other Ambulatory Visit: Payer: Self-pay

## 2019-09-21 VITALS — BP 140/90 | HR 83 | Temp 97.7°F | Wt 370.6 lb

## 2019-09-21 DIAGNOSIS — E7800 Pure hypercholesterolemia, unspecified: Secondary | ICD-10-CM

## 2019-09-21 DIAGNOSIS — E876 Hypokalemia: Secondary | ICD-10-CM

## 2019-09-21 DIAGNOSIS — R7989 Other specified abnormal findings of blood chemistry: Secondary | ICD-10-CM

## 2019-09-21 DIAGNOSIS — I10 Essential (primary) hypertension: Secondary | ICD-10-CM | POA: Diagnosis not present

## 2019-09-21 HISTORY — DX: Pure hypercholesterolemia, unspecified: E78.00

## 2019-09-21 HISTORY — DX: Other specified abnormal findings of blood chemistry: R79.89

## 2019-09-21 NOTE — Progress Notes (Signed)
   Subjective:    Patient ID: Cheryl Morrow, female    DOB: [birth year], 48 y.o.   MRN: [id number]  HPI Chief Complaint  Patient presents with  . follow-up    follow-up on labs   She is here for follow up on hypokalemia and HTN.  Her potassium was 3.3 so I started her on K-dur 4 days ago and plan to recheck her potassium today.  Her BP has improved some since starting on lisinopril-HCTZ started by me on 09/15/19 and with the addition of amlodipine prescribed by Dr. Geraldo Pitter on 09/19/19.  Has a stress test scheduled on December 2nd and 3rd.   She would like to lose weight and requests help with this.   Denies fever, chills, headache, dizziness, chest pain, palpitations, shortness of breath, abdominal pain, N/V/D, urinary symptoms, LE edema.    Reviewed allergies, medications, past medical, surgical, family, and social history.    Review of Systems Pertinent positives and negatives in the history of present illness.     Objective:   Physical Exam BP 140/90   Pulse 83   Temp 97.7 F (36.5 C)   Wt (!) 370 lb 9.6 oz (168.1 kg)   BMI 56.35 kg/m   Alert and oriented and in no acute distress. Not otherwise examined.       Assessment & Plan:  Uncontrolled hypertension - Plan: Amb Ref to Medical Weight Management  Hypokalemia - Plan: Basic Metabolic Panel  Morbid obesity ([place]) - Plan: Amb Ref to Medical Weight Management  Elevated LDL cholesterol level  Elevated serum creatinine  She is feeling well today. Reports good compliance with medications and no side effects. Has not started checking her BP at home yet. Encouraged her to do this.  She will hold off on K-dur supplement until I get her BMP result.  Referral to [facility]. She would benefit greatly from weight loss.  See cardiologist and have stress testing as scheduled.

## 2019-09-21 NOTE — Patient Instructions (Signed)
Continue on your current medications. Buy a BP machine and keep an eye on your BP at home.   GOAL BP is 130/80 or less. You will eventually get there and if not, please let me or your cardiologist know.   I am referring you to [facility] and they will call you.   Follow up with me in 6-8 weeks.

## 2019-09-22 LAB — BASIC METABOLIC PANEL
BUN/Creatinine Ratio: 14 (ref 9–23)
BUN: 14 mg/dL (ref 6–24)
CO2: 25 mmol/L (ref 20–29)
Calcium: 9.4 mg/dL (ref 8.7–10.2)
Chloride: 102 mmol/L (ref 96–106)
Creatinine, Ser: 1.02 mg/dL — ABNORMAL HIGH (ref 0.57–1.00)
GFR calc Af Amer: 76 mL/min/{1.73_m2} (ref >59)
GFR calc non Af Amer: 66 mL/min/{1.73_m2} (ref >59)
Glucose: 95 mg/dL (ref 65–99)
Potassium: 4.1 mmol/L (ref 3.5–5.2)
Sodium: 140 mmol/L (ref 134–144)

## 2019-09-26 ENCOUNTER — Telehealth (HOSPITAL_COMMUNITY): Payer: Self-pay

## 2019-09-26 NOTE — Telephone Encounter (Signed)
Instructions left on the patient's answering machine. Asked to call back with any questions. S.Kaelon Weekes EMTP 

## 2019-10-03 ENCOUNTER — Other Ambulatory Visit: Payer: Self-pay

## 2019-10-03 ENCOUNTER — Ambulatory Visit (HOSPITAL_COMMUNITY): Payer: BC Managed Care – PPO | Attending: Cardiovascular Disease

## 2019-10-03 VITALS — Ht 68.0 in | Wt 371.0 lb

## 2019-10-03 DIAGNOSIS — R079 Chest pain, unspecified: Secondary | ICD-10-CM | POA: Insufficient documentation

## 2019-10-03 DIAGNOSIS — R0789 Other chest pain: Secondary | ICD-10-CM | POA: Insufficient documentation

## 2019-10-03 MED ORDER — REGADENOSON 0.4 MG/5ML IV SOLN
0.4000 mg | Freq: Once | INTRAVENOUS | Status: AC
  Administered 2019-10-03: 0.4 mg via INTRAVENOUS

## 2019-10-03 MED ORDER — TECHNETIUM TC 99M TETROFOSMIN IV KIT
31.5000 | PACK | Freq: Once | INTRAVENOUS | Status: AC | PRN
  Administered 2019-10-03: 31.5 via INTRAVENOUS
  Filled 2019-10-03: qty 32

## 2019-10-04 ENCOUNTER — Other Ambulatory Visit: Payer: Self-pay

## 2019-10-04 ENCOUNTER — Ambulatory Visit (HOSPITAL_BASED_OUTPATIENT_CLINIC_OR_DEPARTMENT_OTHER): Payer: BC Managed Care – PPO

## 2019-10-04 ENCOUNTER — Ambulatory Visit (HOSPITAL_COMMUNITY): Payer: BC Managed Care – PPO | Attending: Internal Medicine

## 2019-10-04 DIAGNOSIS — R079 Chest pain, unspecified: Secondary | ICD-10-CM | POA: Diagnosis present

## 2019-10-04 LAB — MYOCARDIAL PERFUSION IMAGING
LV dias vol: 139 mL (ref 46–106)
LV sys vol: 73 mL
Peak HR: 98 {beats}/min
Rest HR: 82 {beats}/min
SDS: 3
SRS: 0
SSS: 3
TID: 0.88

## 2019-10-04 MED ORDER — TECHNETIUM TC 99M TETROFOSMIN IV KIT
31.5000 | PACK | Freq: Once | INTRAVENOUS | Status: AC | PRN
  Administered 2019-10-04: 31.5 via INTRAVENOUS
  Filled 2019-10-04: qty 32

## 2019-10-05 ENCOUNTER — Telehealth: Payer: Self-pay

## 2019-10-05 NOTE — Telephone Encounter (Signed)
-----   Message from Jenean Lindau, MD sent at 10/05/2019  9:42 AM EST ----- Needs appointment to discuss results. Jenean Lindau, MD 10/05/2019 9:42 AM

## 2019-10-05 NOTE — Telephone Encounter (Signed)
Results relayed, patient scheduled for f/u 10/24/2019 in [facility] office.

## 2019-10-11 ENCOUNTER — Telehealth: Payer: Self-pay | Admitting: Cardiology

## 2019-10-11 DIAGNOSIS — I10 Essential (primary) hypertension: Secondary | ICD-10-CM

## 2019-10-11 NOTE — Telephone Encounter (Signed)
New Message  Pt is calling and stating that the blood pressure medication isn't working as far as taking the fluid off  Please call.

## 2019-10-12 NOTE — Telephone Encounter (Signed)
Patient returned call, please call back  

## 2019-10-12 NOTE — Telephone Encounter (Signed)
She should try stopping l lisinopril hydrochlorothiazide.  She should start on the lowest dose of losartan hydrochlorothiazide.  Keep a track of blood pressures and come back for Chem-7 in a week.  Hopefully the cough will be better.  We will need blood pressure readings so we can adjust her medications.

## 2019-10-12 NOTE — Telephone Encounter (Signed)
Left message for patient to return call.

## 2019-10-12 NOTE — Telephone Encounter (Signed)
Pt called to report that her BP has been doing well.. it has been consistently 128/72 no HR to report.  But she continues to have persistent mild ankle edema and a dry hacking cough sometimes with clear sputum production. She has been experiencing this since Nov. She denies SOB, no chest pain... she would like to ask Dr. Geraldo Pitter if this could be med related or if she needs any other testing done to be sure she dos not have any other problems going on with her.   Will forward to Revankar for review.

## 2019-10-13 MED ORDER — LOSARTAN POTASSIUM-HCTZ 50-12.5 MG PO TABS: 1.0000 | ORAL_TABLET | Freq: Every day | ORAL | 1 refills | Status: DC

## 2019-10-13 NOTE — Telephone Encounter (Signed)
Patient was also advised to keep a log of her blood pressure readings and bring back to [place].

## 2019-10-13 NOTE — Addendum Note (Signed)
Addended by: Ashok Norris on: 10/13/2019 09:24 AM   Modules accepted: Orders

## 2019-10-13 NOTE — Telephone Encounter (Signed)
Called patient. Informed her that per Dr. Geraldo Pitter she should stop taking lisinopril hydrochlorothiazide and start taking losartan-hydrochlorothiazide 50-12.5 mg daily. She was also advised to return in 1 week and have labs redrawn. Patient verbally understood. No further questions.

## 2019-10-13 NOTE — Telephone Encounter (Signed)
Left message for patient to return call.

## 2019-10-24 ENCOUNTER — Ambulatory Visit (INDEPENDENT_AMBULATORY_CARE_PROVIDER_SITE_OTHER): Payer: BC Managed Care – PPO | Admitting: Cardiology

## 2019-10-24 ENCOUNTER — Telehealth: Payer: Self-pay | Admitting: Family Medicine

## 2019-10-24 ENCOUNTER — Other Ambulatory Visit: Payer: Self-pay

## 2019-10-24 ENCOUNTER — Encounter: Payer: Self-pay | Admitting: Cardiology

## 2019-10-24 VITALS — BP 140/98 | HR 86 | Ht 68.0 in | Wt 370.4 lb

## 2019-10-24 DIAGNOSIS — I10 Essential (primary) hypertension: Secondary | ICD-10-CM

## 2019-10-24 MED ORDER — AMLODIPINE BESYLATE 10 MG PO TABS: 10.0000 mg | ORAL_TABLET | Freq: Every day | ORAL | 2 refills | Status: DC

## 2019-10-24 NOTE — Telephone Encounter (Signed)
I recommend a virtual visit to discuss her chronic cough. It does not appear to be medication related nor her heart causing this. We have discussed allergies in the past as an underlying reason for cough and she reported trying an antihistamine without any improvement. Let's discuss this further to see if we can figure this out.

## 2019-10-24 NOTE — Telephone Encounter (Signed)
Pt was notified and has virtual tomorrow

## 2019-10-24 NOTE — Patient Instructions (Signed)
Medication Instructions:  Your physician has recommended you make the following change in your medication:  INCREASE amlodipine from 5mg  to 10 mg (1 tablet) once daily  *If you need a refill on your cardiac medications before your next appointment, please call your pharmacy*  Lab Work: NONE If you have labs (blood work) drawn today and your tests are completely normal, you will receive your results only by: Marland Kitchen MyChart Message (if you have MyChart) OR . A paper copy in the mail If you have any lab test that is abnormal or we need to change your treatment, we will call you to review the results.  Testing/Procedures: You had an EKG performed today  Follow-Up: At [facility], you and your health needs are our priority.  As part of our continuing mission to provide you with exceptional heart care, we have created designated Provider Care Teams.  These Care Teams include your primary Cardiologist (physician) and Advanced Practice Providers (APPs -  Physician Assistants and Nurse Practitioners) who all work together to provide you with the care you need, when you need it.  Your next appointment:   3 month(s)  The format for your next appointment:   In Person  Provider:   Jyl Heinz, MD

## 2019-10-24 NOTE — Progress Notes (Signed)
Cardiology Office Note:    Date:  10/24/2019   ID:  Cheryl Morrow, DOB October 15, 1971, MRN [id number]  PCP:  Girtha Rm, NP-C  Cardiologist:  Jenean Lindau, MD   Referring MD: Girtha Rm, NP-C    ASSESSMENT:    1. Uncontrolled hypertension   2. Morbid obesity ([place])    PLAN:    In order of problems listed above:  1. Essential hypertension: I discussed my findings with patient at length.  Diet was discussed.  I told her that she needs to see her primary care physician for her cough as her ARB is unlikely to give her cough and she promises to do so.  Salt intake issues were discussed.  I doubled amlodipine to 10 mg daily and she will keep a track of her blood pressures and send it to [place] in a week.  We will review and titrate medications as needed. 2. Mixed dyslipidemia: Diet was discussed for this 3. Morbid obesity: Diet was discussed.  Risks of obesity explained and she vocalized understanding.  She also plans to see a dietitian for this. 4. Patient will be seen in follow-up appointment in 3 months or earlier if the patient has any concerns    Medication Adjustments/Labs and Tests Ordered: Current medicines are reviewed at length with the patient today.  Concerns regarding medicines are outlined above.  Orders Placed This Encounter  Procedures  . EKG 12-Lead   Meds ordered this encounter  Medications  . amLODipine (NORVASC) 10 MG tablet    Sig: Take 1 tablet (10 mg total) by mouth daily.    Dispense:  90 tablet    Refill:  2     No chief complaint on file.    History of Present Illness:    Cheryl Morrow is a 48 y.o. female.  Patient was evaluated for EKG abnormalities.  She has essential hypertension and is morbidly obese.  She has mixed dyslipidemia.  She denies any problems at this time and takes care of activities of daily living.  No chest pain orthopnea or PND.  She still has some persistent cough with her ARB.  At the time of my evaluation, the patient  is alert awake oriented and in no distress.  Past Medical History:  Diagnosis Date  . EKG abnormalities 09/15/2019  . Hypokalemia 09/17/2019  . Mild cardiomegaly 09/15/2019  . Morbid obesity ([place]) 09/15/2019  . Prediabetes 09/17/2019  . Uncontrolled hypertension 09/15/2019    Past Surgical History:  Procedure Laterality Date  . HYSTERECTOMY ABDOMINAL WITH SALPINGECTOMY      Current Medications: Current Meds  Medication Sig  . amLODipine (NORVASC) 10 MG tablet Take 1 tablet (10 mg total) by mouth daily.  Marland Kitchen losartan-hydrochlorothiazide (HYZAAR) 50-12.5 MG tablet Take 1 tablet by mouth daily.  . [DISCONTINUED] amLODipine (NORVASC) 5 MG tablet Take 1 tablet (5 mg total) by mouth daily.     Allergies:   Patient has no known allergies.   Social History   Socioeconomic History  . Marital status: Single    Spouse name: Not on file  . Number of children: Not on file  . Years of education: Not on file  . Highest education level: Not on file  Occupational History  . Not on file  Tobacco Use  . Smoking status: Never Smoker  . Smokeless tobacco: Never Used  Substance and Sexual Activity  . Alcohol use: Yes    Comment: occasionally  . Drug use: Never  . Sexual activity: Not  on file  Other Topics Concern  . Not on file  Social History Narrative  . Not on file   Social Determinants of Health   Financial Resource Strain:   . Difficulty of Paying Living Expenses: Not on file  Food Insecurity:   . Worried About Charity fundraiser in the Last Year: Not on file  . Ran Out of Food in the Last Year: Not on file  Transportation Needs:   . Lack of Transportation (Medical): Not on file  . Lack of Transportation (Non-Medical): Not on file  Physical Activity:   . Days of Exercise per Week: Not on file  . Minutes of Exercise per Session: Not on file  Stress:   . Feeling of Stress : Not on file  Social Connections:   . Frequency of Communication with Friends and Family: Not on  file  . Frequency of Social Gatherings with Friends and Family: Not on file  . Attends Religious Services: Not on file  . Active Member of Clubs or Organizations: Not on file  . Attends Archivist Meetings: Not on file  . Marital Status: Not on file     Family History: The patient's family history is not on file.  ROS:   Please see the history of present illness.    All other systems reviewed and are negative.  EKGs/Labs/Other Studies Reviewed:    The following studies were reviewed today: Study Highlights    Nuclear stress EF: 48%.  No T wave inversion was noted during stress.  There was no ST segment deviation noted during stress.  This is an intermediate risk study.   Normal perfusion. LVEF 48%, dilated LV with mild global hypokinesis. Echo correlation recommended. This is an intermediate risk study due to decreased LV function.     IMPRESSIONS    1. Left ventricular ejection fraction, by visual estimation, is 50 to 55%. The left ventricle has low normal function. There is no left ventricular hypertrophy.  2. Inferior basal hypokinesis Abnormal GLS -13.1.  3. Global right ventricle has normal systolic function.The right ventricular size is normal. No increase in right ventricular wall thickness.  4. Left atrial size was mildly dilated.  5. Right atrial size was normal.  6. The mitral valve is normal in structure. Trace mitral valve regurgitation. No evidence of mitral stenosis.  7. The tricuspid valve is normal in structure. Tricuspid valve regurgitation is not demonstrated.  8. The aortic valve is tricuspid. Aortic valve regurgitation is not visualized. Mild aortic valve sclerosis without stenosis.  9. The pulmonic valve was normal in structure. Pulmonic valve regurgitation is not visualized. 10. Normal pulmonary artery systolic pressure. 11. The inferior vena cava is normal in size with greater than 50% respiratory variability, suggesting right atrial  pressure of 3 mmHg.    Recent Labs: 09/15/2019: ALT 16; BNP 54.7; Hemoglobin 13.2; Platelets 204; TSH 0.644 09/19/2019: Magnesium 2.2 09/21/2019: BUN 14; Creatinine, Ser 1.02; Potassium 4.1; Sodium 140  Recent Lipid Panel    Component Value Date/Time   CHOL 200 (H) 09/15/2019 1015   TRIG 98 09/15/2019 1015   HDL 51 09/15/2019 1015   CHOLHDL 3.9 09/15/2019 1015   LDLCALC 131 (H) 09/15/2019 1015    Physical Exam:    VS:  BP (!) 140/98   Pulse 86   Ht 5\' 8"  (1.727 m)   Wt (!) 370 lb 6.4 oz (168 kg)   SpO2 99%   BMI 56.32 kg/m     Wt Readings  from Last 3 Encounters:  10/24/19 (!) 370 lb 6.4 oz (168 kg)  10/03/19 (!) 371 lb (168.3 kg)  09/21/19 (!) 370 lb 9.6 oz (168.1 kg)     GEN: Patient is in no acute distress HEENT: Normal NECK: No JVD; No carotid bruits LYMPHATICS: No lymphadenopathy CARDIAC: Hear sounds regular, 2/6 systolic murmur at the apex. RESPIRATORY:  Clear to auscultation without rales, wheezing or rhonchi  ABDOMEN: Soft, non-tender, non-distended MUSCULOSKELETAL:  No edema; No deformity  SKIN: Warm and dry NEUROLOGIC:  Alert and oriented x 3 PSYCHIATRIC:  Normal affect   Signed, Jenean Lindau, MD  10/24/2019 8:41 AM    [place]

## 2019-10-24 NOTE — Telephone Encounter (Signed)
Pt called and stated that she was seen by a cardiologist for excessive coughing and they told her it was nothing they could do that she needs to be seen by an allergy specialist so pt will need a referral to see one.

## 2019-10-25 ENCOUNTER — Ambulatory Visit (INDEPENDENT_AMBULATORY_CARE_PROVIDER_SITE_OTHER): Payer: BC Managed Care – PPO | Admitting: Family Medicine

## 2019-10-25 ENCOUNTER — Encounter: Payer: Self-pay | Admitting: Family Medicine

## 2019-10-25 VITALS — Wt 370.0 lb

## 2019-10-25 DIAGNOSIS — R053 Chronic cough: Secondary | ICD-10-CM

## 2019-10-25 DIAGNOSIS — R05 Cough: Secondary | ICD-10-CM | POA: Diagnosis not present

## 2019-10-25 DIAGNOSIS — T7840XA Allergy, unspecified, initial encounter: Secondary | ICD-10-CM

## 2019-10-25 MED ORDER — FLUTICASONE PROPIONATE 50 MCG/ACT NA SUSP: 2.0000 | Freq: Every day | NASAL | 6 refills | Status: DC

## 2019-10-25 MED ORDER — FAMOTIDINE 20 MG PO TABS: 20.0000 mg | ORAL_TABLET | Freq: Two times a day (BID) | ORAL | 2 refills | Status: DC

## 2019-10-25 MED ORDER — LEVOCETIRIZINE DIHYDROCHLORIDE 5 MG PO TABS: 5.0000 mg | ORAL_TABLET | Freq: Every evening | ORAL | 2 refills | Status: DC

## 2019-10-25 NOTE — Progress Notes (Signed)
   Subjective:  Documentation for virtual audio and video telecommunications through [place] encounter:  The patient was located at work. 2 patient identifiers used.  The provider was located in the office. The patient did consent to this visit and is aware of possible charges through their insurance for this visit.  The other persons participating in this telemedicine service were none.    Patient ID: Cheryl Morrow, female    DOB: 1971-11-01, 48 y.o.   MRN: [id number]  HPI Chief Complaint  Patient presents with  . chronic cough    chronic cough and allergies  been going on a while   Complain of chronic dry cough.  States cough started after moving from [place] to [place] in March 2019.  Cough is during the day and occasionally wakes her up at night.  She thinks this is related allergies. She is not taking any medication currently. States she has tried several allergy medications in the past. Also c/o itchy, watery eyes, rhinorrhea and nasal congestion at times.   Denies hx of reflux or asthma.  Does not smoke.  She has been seeing her cardiologist and states her BP has improved.  States he was switched from lisinopril to losartan and this did not improve her cough.   Denies fever, chills, chest pain, palpitations, shortness of breath, orthopnea, abdominal pain, N/V/D.   CXR on 09/15/2019 showed clear lungs   Reviewed allergies, medications, past medical, surgical, family, and social history.    Review of Systems Pertinent positives and negatives in the history of present illness.     Objective:   Physical Exam Wt (!) 370 lb (167.8 kg)   BMI 56.26 kg/m   Alert and oriented and in no acute distress.  Respirations unlabored.  Speaking in complete sentences without difficulty.  No coughing during the visit.  Unable to examine further      Assessment & Plan:  Chronic cough  Allergy, initial encounter  She has had this cough for over a year since moving from  [place] to [place].  Previously she reported antihistamines did not improve her cough.  She is interested in seeing an allergist.  I did offer to refer her or we could try treating her symptoms and see if they improve first and then refer her if not.  She prefers this plan.  Discussed possible etiologies for chronic cough including allergies, GERD or reactive airway disease.  Recommend she start on Xyzal, Flonase as well as Pepcid to see if this improves cough.  We will follow-up in 2 weeks to see how she is doing.  Time spent on call was 12 minutes and in review of previous records 15 minutes total.  This virtual service is not related to other E/M service within previous 7 days.

## 2019-11-02 NOTE — Telephone Encounter (Signed)
This encounter was created in error - please disregard.

## 2019-11-09 ENCOUNTER — Ambulatory Visit (INDEPENDENT_AMBULATORY_CARE_PROVIDER_SITE_OTHER): Payer: BC Managed Care – PPO | Admitting: Family Medicine

## 2019-11-09 ENCOUNTER — Other Ambulatory Visit: Payer: Self-pay

## 2019-11-09 ENCOUNTER — Encounter: Payer: Self-pay | Admitting: Family Medicine

## 2019-11-09 VITALS — BP 143/109 | HR 83 | Temp 97.2°F | Wt 370.0 lb

## 2019-11-09 DIAGNOSIS — I10 Essential (primary) hypertension: Secondary | ICD-10-CM | POA: Diagnosis not present

## 2019-11-09 DIAGNOSIS — R05 Cough: Secondary | ICD-10-CM

## 2019-11-09 DIAGNOSIS — R053 Chronic cough: Secondary | ICD-10-CM

## 2019-11-09 NOTE — Progress Notes (Signed)
   Subjective:  Documentation for virtual audio and video telecommunications through [place] encounter:  The patient was located at work. 2 patient identifiers used.  The provider was located in the office. The patient did consent to this visit and is aware of possible charges through their insurance for this visit.  The other persons participating in this telemedicine service were none.    Patient ID: Cheryl Morrow, female    DOB: 07-Jul-1971, 49 y.o.   MRN: [id number]  HPI Chief Complaint  Patient presents with  . 2 week follow-up    follow-up on cough, about 50% better, losaratan is still making her bp high   This is a 2 week follow up on chronic cough.   At her previous visit we discussed her treating for allergies and reflux to see if we could improve her cough. Recommended she use Flonase, Xyzal and Pepcid and she reports using these daily.   States she has noticed some improvement in her cough but still coughing some.   No new complaints today.   Her BP is still elevated. States she is taking her medications. She was advised to keep an eye on her BP and follow up with cardiology if needed. She will call and schedule with them.  Denies fever, chills, chest pain, palpitations, shortness of breath, N/V/D.  Denies lower extremity edema   Reviewed allergies, medications, past medical, surgical, family, and social history.   Review of Systems Pertinent positives and negatives in the history of present illness.     Objective:   Physical Exam BP (!) 143/109   Pulse 83   Temp (!) 97.2 F (36.2 C)   Wt (!) 370 lb (167.8 kg)   BMI 56.26 kg/m   Alert and oriented and in no acute distress.  Respirations unlabored.  Normal speech, mood and thought process       Assessment & Plan:  Chronic cough  Uncontrolled hypertension  Reports cough has improved with Flonase, Xyzal and Pepcid.  Recommend she increase Pepcid to twice daily to see if this continues to improve her  symptoms.  Suspect reflux may be playing a role in her cough.  She is aware that her blood pressure is significantly elevated today.  This is being managed by her cardiologist.  States she will call to let them know what her blood pressure readings have been lately.  She is asymptomatic.   Time spent on call was 12 minutes and in review of previous records 18 minutes total.  This virtual service is not related to other E/M service within previous 7 days.

## 2019-11-14 ENCOUNTER — Telehealth: Payer: Self-pay | Admitting: Cardiology

## 2019-11-14 DIAGNOSIS — I10 Essential (primary) hypertension: Secondary | ICD-10-CM

## 2019-11-14 NOTE — Telephone Encounter (Signed)
BP 168/109 today prior to morning meds, Last night 2200-156/106, yesterday morning prior to meds 160/113. Patient states her BP is more elevated now since she switched from lisinopril. No other symptoms such as headaches/SOB. Patient would like to know if her dosage needs to be altered. Note routed to Dr. Docia Furl for advisement.

## 2019-11-14 NOTE — Telephone Encounter (Signed)
Please get me a correct list of patient's medications.  Also add another 12.5 mg hydrochlorothiazide and let her keep a track of her blood pressures and get a Chem-7 on Friday first thing in the morning.

## 2019-11-14 NOTE — Telephone Encounter (Signed)
States her BP medicine is not lowering it enough

## 2019-11-15 MED ORDER — HYDROCHLOROTHIAZIDE 25 MG PO TABS: 12.5000 mg | ORAL_TABLET | Freq: Every day | ORAL | 3 refills | Status: DC

## 2019-11-15 NOTE — Addendum Note (Signed)
Addended by: Beckey Rutter on: 11/15/2019 02:03 PM   Modules accepted: Orders

## 2019-11-15 NOTE — Telephone Encounter (Signed)
Information relayed. patient informed to bring her up to date BP log back next Friday when she comes in for labs. Medications verified. No further questions at this time.

## 2019-11-24 ENCOUNTER — Telehealth: Payer: Self-pay | Admitting: Family Medicine

## 2019-11-24 DIAGNOSIS — R053 Chronic cough: Secondary | ICD-10-CM

## 2019-11-24 DIAGNOSIS — R05 Cough: Secondary | ICD-10-CM

## 2019-11-24 NOTE — Telephone Encounter (Signed)
Pt called and said she was told she could get a referral to see an allergist if she is still coughing. She said she is coughing and wanted to get that referral

## 2019-11-24 NOTE — Telephone Encounter (Signed)
Placed referral  

## 2019-11-24 NOTE — Telephone Encounter (Signed)
Ok to refer her to allergist due to persistent cough and allergies.

## 2019-12-11 ENCOUNTER — Other Ambulatory Visit: Payer: Self-pay | Admitting: Cardiology

## 2019-12-14 ENCOUNTER — Other Ambulatory Visit: Payer: Self-pay

## 2019-12-14 ENCOUNTER — Encounter: Payer: Self-pay | Admitting: Allergy

## 2019-12-14 ENCOUNTER — Ambulatory Visit (INDEPENDENT_AMBULATORY_CARE_PROVIDER_SITE_OTHER): Payer: BC Managed Care – PPO | Admitting: Allergy

## 2019-12-14 VITALS — BP 174/98 | HR 76 | Temp 97.3°F | Resp 16 | Ht 67.0 in | Wt 380.8 lb

## 2019-12-14 DIAGNOSIS — J3089 Other allergic rhinitis: Secondary | ICD-10-CM

## 2019-12-14 DIAGNOSIS — R05 Cough: Secondary | ICD-10-CM

## 2019-12-14 DIAGNOSIS — R053 Chronic cough: Secondary | ICD-10-CM

## 2019-12-14 MED ORDER — AZELASTINE HCL 0.1 % NA SOLN: 2.0000 | Freq: Two times a day (BID) | NASAL | 5 refills | Status: DC

## 2019-12-14 MED ORDER — CARBINOXAMINE MALEATE 6 MG PO TABS: 1.0000 | ORAL_TABLET | Freq: Two times a day (BID) | ORAL | 1 refills | Status: DC

## 2019-12-14 NOTE — Progress Notes (Signed)
New Patient Note  RE: Cheryl Morrow MRN: [id number] DOB: 11-01-1971 Date of Office Visit: 12/14/2019  Referring provider: Girtha Rm, NP-C Primary care provider: Girtha Rm, NP-C  Chief Complaint: cough  History of present illness: Cheryl Morrow is a 49 y.o. female presenting today for consultation for chronic cough.   She reports having a cough since moving to this area from [place] area in 12/2017.   Cough is dry and occurs all throughout the day.  She does feel like it has woken her up out of sleep on occasion.  She does feel like there is a tickle in the throat that triggers the cough and she does throat clear a lot to see if she can clear it but that doesn't really help.  She does feel like she can have trouble getting air in and out.  She has noticed a wheeze at the end of coughing spells.  She states she could barely carry on a conversation without coughing.   She has tried cough medications which she states just made her sleepy.  Her PCP did recommend she try taking Xyzal, Flonase and Pepcid to see if this would improve the cough but still having cough.  Thus she stopped taking these medications last week.     She does feel overall however that the cough is not as bad as earlier in onset of the cough.   She does report seasonal allergies with watery eyes, dry cough (that would go away), nasal congestion/drainage.  She has used the OTC medications including zyrtec and claritin and benadryl.    She is following with a cardiologist due to having "fluid around my heart".   She did have more fluid around the heart she states she did have a chest tightness sensation that was worse when she was lying down which has resolved.  She is now on amlodipine and Hyzaar.  She states that has been resolved but she still has the cough.    She did have a CXR on 09/15/2019 showing an "enlarged cardiac silhouette that could be due to cardiomegaly and/or pericardial  fluid. Tortuous aorta."  Lungs were clear on imaging.    No history asthma, eczema or food allergy    Review of systems: Review of Systems  Constitutional: Negative.   HENT: Negative.   Eyes: Negative.   Respiratory: Positive for cough.   Cardiovascular: Negative.   Gastrointestinal: Negative.   Musculoskeletal: Negative.   Skin: Negative.   Neurological: Negative.     All other systems negative unless noted above in HPI  Past medical history: Past Medical History:  Diagnosis Date  . EKG abnormalities 09/15/2019  . Hypokalemia 09/17/2019  . Mild cardiomegaly 09/15/2019  . Morbid obesity ([place]) 09/15/2019  . Prediabetes 09/17/2019  . Uncontrolled hypertension 09/15/2019    Past surgical history: Past Surgical History:  Procedure Laterality Date  . HYSTERECTOMY ABDOMINAL WITH SALPINGECTOMY      Family history:  Family History  Problem Relation Age of Onset  . Hypertension Mother   . Hypertension Father   . Allergic rhinitis Neg Hx   . Angioedema Neg Hx   . Asthma Neg Hx   . Eczema Neg Hx   . Atopy Neg Hx   . Immunodeficiency Neg Hx   . Urticaria Neg Hx     Social history: She lives in a trailer with carpeting in the bedroom with electric heating and central cooling.  No pets in the home.  No  concern for water damage, mildew or roaches in the home.  She answers the phone and greets people me and she will.  She denies a smoking history.  Medication List: Current Outpatient Medications  Medication Sig Dispense Refill  . famotidine (PEPCID) 20 MG tablet Take 1 tablet (20 mg total) by mouth 2 (two) times daily. 60 tablet 2  . fluticasone (FLONASE) 50 MCG/ACT nasal spray Place 2 sprays into both nostrils daily. 16 g 6  . hydrochlorothiazide (HYDRODIURIL) 25 MG tablet Take 0.5 tablets (12.5 mg total) by mouth daily. 30 tablet 3  . levocetirizine (XYZAL) 5 MG tablet Take 1 tablet (5 mg total) by mouth every evening. 30 tablet 2  . amLODipine (NORVASC) 10 MG tablet  Take 1 tablet (10 mg total) by mouth daily. (Patient not taking: Reported on 12/14/2019) 90 tablet 2  . azelastine (ASTELIN) 0.1 % nasal spray Place 2 sprays into both nostrils 2 (two) times daily. 30 mL 5  . Carbinoxamine Maleate (RYVENT) 6 MG TABS Take 1 tablet by mouth 2 (two) times daily. 180 tablet 1  . losartan-hydrochlorothiazide (HYZAAR) 50-12.5 MG tablet TAKE 1 TABLET BY MOUTH DAILY 30 tablet 10   No current facility-administered medications for this visit.    Known medication allergies: No Known Allergies   Physical examination: Blood pressure (!) 174/98, pulse 76, temperature (!) 97.3 F (36.3 C), temperature source Temporal, resp. rate 16, height 5\' 7"  (1.702 m), weight (!) 380 lb 12.8 oz (172.7 kg), SpO2 99 %.  General: Alert, interactive, in no acute distress. HEENT: PERRLA, TMs pearly gray, turbinates mildly edematous with clear discharge, post-pharynx non erythematous. Neck: Supple without lymphadenopathy. Lungs: Clear to auscultation without wheezing, rhonchi or rales. {no increased work of breathing. CV: Normal S1, S2 without murmurs. Abdomen: Nondistended, nontender. Skin: Warm and dry, without lesions or rashes. Extremities:  No clubbing, cyanosis or edema. Neuro:   Grossly intact.  Diagnositics/Labs:  Spirometry: FEV1: 2.12L 80%, FVC: 2.59L 79%, ratio consistent with nonobstructive pattenr  Allergy testing: Environmental allergy skin prick testing is positive to grasses, weeds.  Intradermal testing is positive to tree mix, mold mix 2, cat and cockroach. 10 food common skin prick testing is negative. Allergy testing results were read and interpreted by provider, documented or you when you would hypo you when I have the results I will home by clinical staff.   Assessment and plan:  Chronic cough, allergen driven Allergic rhinitis  - environmental allergy skin testing is positive to grasses, weeds, trees, molds, cat, cockroach.  Allergen avoidance measures  discussed/handouts provided - will have you try albuterol inhaler.  have access to albuterol inhaler 2 puffs every 4-6 hours as needed for cough/wheeze/shortness of breath/chest tightness.  May use 15-20 minutes prior to activity.   Monitor frequency of use.   This will help determine if cough has a reactive lung component vs all allergy triggered - to see if you have a component of post-nasal drainage recommend using nasal antihistamine, Astelin 2 sprays each nostril twice a day.   Let us know if this decrease tickle sensation in throat as well as cough - will have you try antihistamine, Ryvent 6mg  twice a day.  This should help based on the degree of your positives on allergy testing - your lung function testing looked good today which is reassuring  Follow-up in 3 months or sooner if needed  I appreciate the opportunity to take part in [place] care. Please do not hesitate to contact me with questions.  Sincerely,  Prudy Feeler, MD Allergy/Immunology [facility]

## 2019-12-14 NOTE — Patient Instructions (Addendum)
-   environmental allergy skin testing is positive to grasses, weeds, trees, molds, cat, cockroach.  Allergen avoidance measures discussed/handouts provided - will have you try albuterol inhaler.  have access to albuterol inhaler 2 puffs every 4-6 hours as needed for cough/wheeze/shortness of breath/chest tightness.  May use 15-20 minutes prior to activity.   Monitor frequency of use.   This will help determine if cough has a reactive lung component vs all allergy triggered - to see if you have a component of post-nasal drainage recommend using nasal antihistamine, Astelin 2 sprays each nostril twice a day.   Let us know if this decrease tickle sensation in throat as well as cough - will have you try antihistamine, Ryvent 6mg  twice a day.  This should help based on the degree of your positives on allergy testing - your lung function testing looked good today which is reassuring  Follow-up in 3 months or sooner if needed

## 2019-12-20 ENCOUNTER — Ambulatory Visit: Payer: BC Managed Care – PPO | Admitting: Cardiology

## 2020-01-22 ENCOUNTER — Encounter: Payer: Self-pay | Admitting: Cardiology

## 2020-01-22 ENCOUNTER — Ambulatory Visit: Payer: BC Managed Care – PPO | Admitting: Family

## 2020-01-22 ENCOUNTER — Ambulatory Visit (INDEPENDENT_AMBULATORY_CARE_PROVIDER_SITE_OTHER): Payer: BC Managed Care – PPO | Admitting: Cardiology

## 2020-01-22 ENCOUNTER — Other Ambulatory Visit: Payer: Self-pay

## 2020-01-22 VITALS — BP 116/84 | HR 90 | Ht 67.0 in | Wt 370.0 lb

## 2020-01-22 DIAGNOSIS — I10 Essential (primary) hypertension: Secondary | ICD-10-CM

## 2020-01-22 DIAGNOSIS — E876 Hypokalemia: Secondary | ICD-10-CM | POA: Diagnosis not present

## 2020-01-22 DIAGNOSIS — R0789 Other chest pain: Secondary | ICD-10-CM | POA: Diagnosis not present

## 2020-01-22 NOTE — Progress Notes (Signed)
Cardiology Office Note:    Date:  01/22/2020   ID:  Cheryl Morrow, DOB 1971-04-09, MRN [id number]  PCP:  Girtha Rm, NP-C  Cardiologist:  Jenne Campus, MD    Referring MD: Girtha Rm, NP-C   No chief complaint on file. Doing fine  History of Present Illness:    Cheryl Morrow is a 49 y.o. female with past medical history significant for atypical chest pain, stress test show no evidence of ischemia that was done at the end of last year, essential hypertension, morbid obesity, prediabetes.  She comes today to my office for follow-up about her hypertension.  Her blood pressure for the first time seems to be controlled.  She does have blood pressure monitored to check her blood pressure decreased and I told her to get the one check on the shoulder.  Denies have any chest pain, tightness, pressure, burning the chest.  Past Medical History:  Diagnosis Date  . EKG abnormalities 09/15/2019  . Hypokalemia 09/17/2019  . Mild cardiomegaly 09/15/2019  . Morbid obesity ([place]) 09/15/2019  . Prediabetes 09/17/2019  . Uncontrolled hypertension 09/15/2019    Past Surgical History:  Procedure Laterality Date  . HYSTERECTOMY ABDOMINAL WITH SALPINGECTOMY      Current Medications: Current Meds  Medication Sig  . amLODipine (NORVASC) 10 MG tablet Take 1 tablet (10 mg total) by mouth daily.  Marland Kitchen azelastine (ASTELIN) 0.1 % nasal spray Place 2 sprays into both nostrils 2 (two) times daily.  . Carbinoxamine Maleate (RYVENT) 6 MG TABS Take 1 tablet by mouth 2 (two) times daily.  Marland Kitchen losartan-hydrochlorothiazide (HYZAAR) 50-12.5 MG tablet TAKE 1 TABLET BY MOUTH DAILY     Allergies:   Lisinopril   Social History   Socioeconomic History  . Marital status: Single    Spouse name: Not on file  . Number of children: Not on file  . Years of education: Not on file  . Highest education level: Not on file  Occupational History  . Not on file  Tobacco Use  . Smoking status: Never Smoker    . Smokeless tobacco: Never Used  Substance and Sexual Activity  . Alcohol use: Yes    Comment: occasionally  . Drug use: Never  . Sexual activity: Not on file  Other Topics Concern  . Not on file  Social History Narrative  . Not on file   Social Determinants of Health   Financial Resource Strain:   . Difficulty of Paying Living Expenses:   Food Insecurity:   . Worried About Charity fundraiser in the Last Year:   . Arboriculturist in the Last Year:   Transportation Needs:   . Film/video editor (Medical):   Marland Kitchen Lack of Transportation (Non-Medical):   Physical Activity:   . Days of Exercise per Week:   . Minutes of Exercise per Session:   Stress:   . Feeling of Stress :   Social Connections:   . Frequency of Communication with Friends and Family:   . Frequency of Social Gatherings with Friends and Family:   . Attends Religious Services:   . Active Member of Clubs or Organizations:   . Attends Archivist Meetings:   Marland Kitchen Marital Status:      Family History: The patient's family history includes Hypertension in her father and mother. There is no history of Allergic rhinitis, Angioedema, Asthma, Eczema, Atopy, Immunodeficiency, or Urticaria. ROS:   Please see the history of present illness.    All  14 point review of systems negative except as described per history of present illness  EKGs/Labs/Other Studies Reviewed:      Recent Labs: 09/15/2019: ALT 16; BNP 54.7; Hemoglobin 13.2; Platelets 204; TSH 0.644 09/19/2019: Magnesium 2.2 09/21/2019: BUN 14; Creatinine, Ser 1.02; Potassium 4.1; Sodium 140  Recent Lipid Panel    Component Value Date/Time   CHOL 200 (H) 09/15/2019 1015   TRIG 98 09/15/2019 1015   HDL 51 09/15/2019 1015   CHOLHDL 3.9 09/15/2019 1015   LDLCALC 131 (H) 09/15/2019 1015    Physical Exam:    VS:  BP 116/84 (BP Location: Right Arm, Patient Position: Sitting, Cuff Size: Large)   Pulse 90   Ht 5\' 7"  (1.702 m)   Wt (!) 370 lb (167.8  kg)   SpO2 96%   BMI 57.95 kg/m     Wt Readings from Last 3 Encounters:  01/22/20 (!) 370 lb (167.8 kg)  12/14/19 (!) 380 lb 12.8 oz (172.7 kg)  11/09/19 (!) 370 lb (167.8 kg)     GEN:  Well nourished, well developed in no acute distress HEENT: Normal NECK: No JVD; No carotid bruits LYMPHATICS: No lymphadenopathy CARDIAC: RRR, no murmurs, no rubs, no gallops RESPIRATORY:  Clear to auscultation without rales, wheezing or rhonchi  ABDOMEN: Soft, non-tender, non-distended MUSCULOSKELETAL:  No edema; No deformity  SKIN: Warm and dry LOWER EXTREMITIES: no swelling NEUROLOGIC:  Alert and oriented x 3 PSYCHIATRIC:  Normal affect   ASSESSMENT:    1. Essential hypertension   2. Hypokalemia   3. Morbid obesity ([place])   4. Chest discomfort    PLAN:    In order of problems listed above:  1. Essential hypertension blood pressure is controlled today.  However look at the blood pressure measurements from her that she use of the wrist.  Blood pressure fluctuates a lot.  I asked her to get blood pressure monitor to check blood pressure on the shoulder.  I will not alter any of her medications today. 2. History of hypokalemia.  She required some potassium supplementation.  I will check a Chem-7 today. 3. Morbid obesity: It is problem she already signed up for weight loss program.  She would like to talk about potentially getting surgery for her obesity.  With her BMI being 56 it is already recommended. 4. Dyslipidemia: Her LDL is elevated.  She is not taking any statin.  However, she started losing weight and working on it we will recheck her fasting lipid profile within next 3 months and then decide about potential treatment.   Medication Adjustments/Labs and Tests Ordered: Current medicines are reviewed at length with the patient today.  Concerns regarding medicines are outlined above.  No orders of the defined types were placed in this encounter.  Medication changes: No orders of the  defined types were placed in this encounter.   Signed, Park Liter, MD, [facility] 01/22/2020 11:14 AM    [place]

## 2020-01-22 NOTE — Addendum Note (Signed)
Addended by: Ashok Norris on: 01/22/2020 11:20 AM   Modules accepted: Orders

## 2020-01-22 NOTE — Patient Instructions (Signed)

## 2020-01-24 LAB — BASIC METABOLIC PANEL
BUN/Creatinine Ratio: 18 (ref 9–23)
BUN: 25 mg/dL — ABNORMAL HIGH (ref 6–24)
CO2: 24 mmol/L (ref 20–29)
Calcium: 9.1 mg/dL (ref 8.7–10.2)
Chloride: 95 mmol/L — ABNORMAL LOW (ref 96–106)
Creatinine, Ser: 1.37 mg/dL — ABNORMAL HIGH (ref 0.57–1.00)
GFR calc Af Amer: 53 mL/min/{1.73_m2} — ABNORMAL LOW (ref >59)
GFR calc non Af Amer: 46 mL/min/{1.73_m2} — ABNORMAL LOW (ref >59)
Glucose: 110 mg/dL — ABNORMAL HIGH (ref 65–99)
Potassium: 3.4 mmol/L — ABNORMAL LOW (ref 3.5–5.2)
Sodium: 137 mmol/L (ref 134–144)

## 2020-01-30 MED ORDER — POTASSIUM CHLORIDE ER 10 MEQ PO TBCR: 10.0000 meq | EXTENDED_RELEASE_TABLET | Freq: Every day | ORAL | 12 refills | Status: DC

## 2020-01-30 NOTE — Addendum Note (Signed)
Addended by: Truddie Hidden on: 01/30/2020 09:13 AM   Modules accepted: Orders

## 2020-02-01 ENCOUNTER — Encounter: Payer: Self-pay | Admitting: Internal Medicine

## 2020-02-08 ENCOUNTER — Encounter: Payer: Self-pay | Admitting: Family Medicine

## 2020-02-08 ENCOUNTER — Other Ambulatory Visit: Payer: Self-pay | Admitting: Family Medicine

## 2020-02-08 ENCOUNTER — Ambulatory Visit (INDEPENDENT_AMBULATORY_CARE_PROVIDER_SITE_OTHER): Payer: BC Managed Care – PPO | Admitting: Family Medicine

## 2020-02-08 ENCOUNTER — Other Ambulatory Visit: Payer: Self-pay

## 2020-02-08 ENCOUNTER — Encounter: Payer: Self-pay | Admitting: Internal Medicine

## 2020-02-08 DIAGNOSIS — I517 Cardiomegaly: Secondary | ICD-10-CM

## 2020-02-08 DIAGNOSIS — E876 Hypokalemia: Secondary | ICD-10-CM

## 2020-02-08 DIAGNOSIS — M1710 Unilateral primary osteoarthritis, unspecified knee: Secondary | ICD-10-CM

## 2020-02-08 DIAGNOSIS — E7800 Pure hypercholesterolemia, unspecified: Secondary | ICD-10-CM | POA: Diagnosis not present

## 2020-02-08 DIAGNOSIS — R7303 Prediabetes: Secondary | ICD-10-CM

## 2020-02-08 DIAGNOSIS — I10 Essential (primary) hypertension: Secondary | ICD-10-CM | POA: Insufficient documentation

## 2020-02-08 HISTORY — DX: Unilateral primary osteoarthritis, unspecified knee: M17.10

## 2020-02-08 NOTE — Telephone Encounter (Signed)
She gets this from cardiology. Last refilled on 3/21

## 2020-02-08 NOTE — Progress Notes (Signed)
Subjective:    Patient ID: Cheryl Morrow, female    DOB: December 08, 1970, 49 y.o.   MRN: [id number]  HPI Chief Complaint  Patient presents with  . weight loss    weight loss- forms for [place] surgery   She is here requesting that I fill out forms for her weight loss consultation and bariatric surgery with [facility] and to write a letter of necessity. States she has decided that she would like to pursue gastric sleeve surgery.  States her supportive system is good. States she has a good friend who is going through a similar process for bariatric surgery.  States she has been attempting weight loss for the past 20 years.    States she has tried several diets including liquid diet, low carb diet among others.   States she went to [facility] in [place] in 2019 and 2020. States they gave her injections and oral medication.  States she also tried Phentermine in 2005.   She is currently doing intermittent fasting and is seeing some weight loss but states her weight fluctuates because it is more difficult for her to follow a particular diet on the weekends.  Denies ever doing regular exercise.   States she is doing a workout video at home now.   Uncontrolled HTN- states she is taking losartan -HCTZ twice daily. She is also taking amlodipine 10 mg. Under the care of her cardiologist for HTN.   Chronic right knee pain. History of arthritis. She has seen Dr. Erlinda Hong in the past. Received an injection in her right knee in 2019. Has not been back since.  Requesting Tramadol today. States she is alternating Tylenol and ibuprofen but sometimes this does not work for her pain.   History of prediabetes and elevated LDL in the past. Is not on a statin.   Denies fever, chills, dizziness, chest pain, palpitations, shortness of breath, abdominal pain, N/V/D, urinary symptoms, LE edema.   Reviewed allergies, medications, past medical, surgical, family, and social history.      Review of  Systems Pertinent positives and negatives in the history of present illness.     Objective:   Physical Exam BP 130/90   Pulse 73   Temp 97.7 F (36.5 C)   Ht 5' 7.25" (1.708 m)   Wt (!) 377 lb 12.8 oz (171.4 kg)   SpO2 99%   BMI 58.73 kg/m   Alert and oriented and in no acute distress. Not otherwise examined.       Assessment & Plan:  Morbid obesity ([place])  Uncontrolled hypertension  Mild cardiomegaly  Elevated LDL cholesterol level  Arthritis of knee  Prediabetes  She is aware of potential worsening health related to morbid obesity.  Discussed that she has several co-morbidities which makes the necessity for weight loss significant in my opinion. She has tried multiple diets and medications over the past 20 years without any real success.  I am in favor of her consulting with [facility] for weight loss and if she is a good candidate in their opinion, she may benefit from gastric sleeve surgery. Discussed that healthy lifestyle is key to weight loss even if she were to undergo surgery.  She is under the care of a cardiologist for HTN. She is aware that her BP is slightly elevated today.  Discussed that Tramadol is a controlled substance and that I am unable to treat her chronic knee pain. I recommend adding a topical analgesic to her knee and if her current  therapies are not working to follow up with her orthopedist Dr. Erlinda Hong.   She will follow up with me for a fasting CPE.  I am happy to write a letter of medical necessity for her to have a consultation with [facility].

## 2020-02-18 ENCOUNTER — Other Ambulatory Visit: Payer: Self-pay | Admitting: Family Medicine

## 2020-02-18 DIAGNOSIS — E876 Hypokalemia: Secondary | ICD-10-CM

## 2020-02-19 NOTE — Telephone Encounter (Signed)
Refilled for a year from [facility]

## 2020-03-03 NOTE — Progress Notes (Signed)
Subjective:    Patient ID: Cheryl Morrow, female    DOB: [birth year], 49 y.o.   MRN: [id number]  HPI Chief Complaint  Patient presents with  . cpe    fasting cpe, with pap. no other concerns   She is here for a complete physical exam. Previous medical care: in [place]: years ago   Other providers: Cardiologist- Dr. Geraldo Pitter  Allergist - Dr. Nelva Bush   States she has a weight loss consult this week with Dr. Redmond Pulling.   Seeing an allergist and cough is under control.   She sees cardiology for HTN. Taking medication without any concerns.   Hx of prediabetes and needs to have her A1c checked.   Social history: Lives alone, has a 28 year old son, works as Librarian, academic with [employer]  Diet: trying to eat healthy foods  Excerise: walks   Immunizations: Tdap- unknown   Health maintenance:  Mammogram: last one in [place] 3 years ago  Colonoscopy: never had a colonoscopy  Last Gynecological Exam: hysterectomy due to heavy bleeding and fibroids in 2017  Last Menstrual cycle: hysterectomy  Last Dental Exam: overdue  Last Eye Exam: overdue. Has contact lenses   Wears seatbelt always, smoke detectors in home and functioning, does not text while driving and feels safe in home environment.   Reviewed allergies, medications, past medical, surgical, family, and social history.    Review of Systems Review of Systems Constitutional: -fever, -chills, -sweats, -unexpected weight change,-fatigue ENT: -runny nose, -ear pain, -sore throat Cardiology:  -chest pain, -palpitations, -edema Respiratory: -cough, -shortness of breath, -wheezing Gastroenterology: -abdominal pain, -nausea, -vomiting, -diarrhea, -constipation  Hematology: -bleeding or bruising problems Musculoskeletal: -arthralgias, -myalgias, -joint swelling, -back pain Ophthalmology: -vision changes Urology: -dysuria, -difficulty urinating, -hematuria, -urinary frequency, -urgency Neurology: -headache,  -weakness, -tingling, -numbness       Objective:   Physical Exam BP 120/70   Pulse 73   Temp (!) 96.8 F (36 C)   Ht 5\' 7"  (1.702 m)   Wt (!) 377 lb 6.4 oz (171.2 kg)   SpO2 98%   BMI 59.11 kg/m   General Appearance:    Alert, cooperative, no distress, appears stated age  Head:    Normocephalic, without obvious abnormality, atraumatic  Eyes:    PERRL, conjunctiva/corneas clear, EOM's intact  Ears:    Normal TM's and external ear canals  Nose:   Mask in place   Throat:   Mask   Neck:   Supple, no lymphadenopathy;  thyroid:  no   enlargement/tenderness/nodules; no JVD  Back:    Spine nontender, no curvature, ROM normal, no CVA     tenderness  Lungs:     Clear to auscultation bilaterally without wheezes, rales or     ronchi; respirations unlabored  Chest Wall:    No tenderness or deformity   Heart:    Regular rate and rhythm, S1 and S2 normal, no murmur, rub   or gallop  Breast Exam:    Declines. Mammogram ordered   Abdomen:     Soft, non-tender, nondistended, normoactive bowel sounds,    no masses, no hepatosplenomegaly  Genitalia:    Declines. Hysterectomy      Extremities:   No clubbing, cyanosis or edema  Pulses:   2+ and symmetric all extremities  Skin:   Skin color, texture, turgor normal, no rashes or lesions  Lymph nodes:   Cervical, supraclavicular, and axillary nodes normal  Neurologic:   CNII-XII intact, normal strength, sensation and  gait          Psych:   Normal mood, affect, hygiene and grooming.         Assessment & Plan:  Routine general medical examination at a health care facility - Plan: CBC with Differential/Platelet, Comprehensive metabolic panel, TSH, T4, free, T3, Lipid panel -Here for a CPE.  Preventive health care reviewed and updated.  Immunizations also reviewed and updated.  Counseling on healthy lifestyle including diet exercise.  She does have an appointment with weight loss counselor this week.  Discussed safety  Prediabetes - Plan:  Hemoglobin A1c -Check A1c and follow-up.  Discussed diet exercise  Essential hypertension - Plan: CBC with Differential/Platelet, Comprehensive metabolic panel -Controlled.  Continue current medications.  Followed by cardiology  Elevated serum creatinine - Plan: Comprehensive metabolic panel -Continue to monitor  Elevated LDL cholesterol level - Plan: Lipid panel -Counseling on healthy diet and exercise.  Follow-up pending lipid panel  Screen for colon cancer - Plan: Ambulatory referral to Gastroenterology Referral to GI for her first colonoscopy.  Encounter for screening mammogram for malignant neoplasm of breast - Plan: MM DIGITAL SCREENING BILATERAL  Need for Tdap vaccination - Plan: Tdap vaccine greater than or equal to 7yo IM -Counseling done on all components of the vaccine  Enlarged thyroid gland - Plan: TSH, T4, free, T3 -Check thyroid function.  Consider ultrasound.  We will follow-up  Screen for STD (sexually transmitted disease) - Plan: HIV Antibody (routine testing w rflx), RPR, GC/Chlamydia Probe Amp -Done per patient request

## 2020-03-03 NOTE — Patient Instructions (Addendum)
Call and schedule your mammogram and [facility] as discussed.   You will hear from [facility].   You can call and schedule your Dentist appointment at any of the following offices:   [facility] Address: [address]  [place], [place] [address] Phone #: [phone]  [facility] Address: [address] [place], [place] [address] Phone # [phone]  [facility]  Address: [address]                                                                  [place], [place] [address]       Phone #: [phone]       Preventive Care 48-65 Years Old, Female Preventive care refers to visits with your health care provider and lifestyle choices that can promote health and wellness. This includes:  A yearly physical exam. This may also be called an annual well check.  Regular dental visits and eye exams.  Immunizations.  Screening for certain conditions.  Healthy lifestyle choices, such as eating a healthy diet, getting regular exercise, not using drugs or products that contain nicotine and tobacco, and limiting alcohol use. What can I expect for my preventive care visit? Physical exam Your health care provider will check your:  Height and weight. This may be used to calculate body mass index (BMI), which tells if you are at a healthy weight.  Heart rate and blood pressure.  Skin for abnormal spots. Counseling Your health care provider may ask you questions about your:  Alcohol, tobacco, and drug use.  Emotional well-being.  Home and relationship well-being.  Sexual activity.  Eating habits.  Work and work Statistician.  Method of birth control.  Menstrual cycle.  Pregnancy history. What immunizations do I need?  Influenza (flu) vaccine  This is recommended every year. Tetanus, diphtheria, and pertussis (Tdap) vaccine  You may need a Td booster every 10  years. Varicella (chickenpox) vaccine  You may need this if you have not been vaccinated. Zoster (shingles) vaccine  You may need this after age 75. Measles, mumps, and rubella (MMR) vaccine  You may need at least one dose of MMR if you were born in 1957 or later. You may also need a second dose. Pneumococcal conjugate (PCV13) vaccine  You may need this if you have certain conditions and were not previously vaccinated. Pneumococcal polysaccharide (PPSV23) vaccine  You may need one or two doses if you smoke cigarettes or if you have certain conditions. Meningococcal conjugate (MenACWY) vaccine  You may need this if you have certain conditions. Hepatitis A vaccine  You may need this if you have certain conditions or if you travel or work in places where you may be exposed to hepatitis A. Hepatitis B vaccine  You may need this if you have certain conditions or if you travel or work in places where you may be exposed to hepatitis B. Haemophilus influenzae type b (Hib) vaccine  You may need this if you have certain conditions. Human papillomavirus (HPV) vaccine  If recommended by your health care provider, you may need three doses over 6 months. You may receive vaccines as individual doses or as more than one vaccine together in one  shot (combination vaccines). Talk with your health care provider about the risks and benefits of combination vaccines. What tests do I need? Blood tests  Lipid and cholesterol levels. These may be checked every 5 years, or more frequently if you are over 47 years old.  Hepatitis C test.  Hepatitis B test. Screening  Lung cancer screening. You may have this screening every year starting at age 71 if you have a 30-pack-year history of smoking and currently smoke or have quit within the past 15 years.  Colorectal cancer screening. All adults should have this screening starting at age 75 and continuing until age 61. Your health care provider may  recommend screening at age 3 if you are at increased risk. You will have tests every 1-10 years, depending on your results and the type of screening test.  Diabetes screening. This is done by checking your blood sugar (glucose) after you have not eaten for a while (fasting). You may have this done every 1-3 years.  Mammogram. This may be done every 1-2 years. Talk with your health care provider about when you should start having regular mammograms. This may depend on whether you have a family history of breast cancer.  BRCA-related cancer screening. This may be done if you have a family history of breast, ovarian, tubal, or peritoneal cancers.  Pelvic exam and Pap test. This may be done every 3 years starting at age 87. Starting at age 15, this may be done every 5 years if you have a Pap test in combination with an HPV test. Other tests  Sexually transmitted disease (STD) testing.  Bone density scan. This is done to screen for osteoporosis. You may have this scan if you are at high risk for osteoporosis. Follow these instructions at home: Eating and drinking  Eat a diet that includes fresh fruits and vegetables, whole grains, lean protein, and low-fat dairy.  Take vitamin and mineral supplements as recommended by your health care provider.  Do not drink alcohol if: ? Your health care provider tells you not to drink. ? You are pregnant, may be pregnant, or are planning to become pregnant.  If you drink alcohol: ? Limit how much you have to 0-1 drink a day. ? Be aware of how much alcohol is in your drink. In the [place] one drink equals one 12 oz bottle of beer (355 mL), one 5 oz glass of wine (148 mL), or one 1 oz glass of hard liquor (44 mL). Lifestyle  Take daily care of your teeth and gums.  Stay active. Exercise for at least 30 minutes on 5 or more days each week.  Do not use any products that contain nicotine or tobacco, such as cigarettes, e-cigarettes, and chewing tobacco. If  you need help quitting, ask your health care provider.  If you are sexually active, practice safe sex. Use a condom or other form of birth control (contraception) in order to prevent pregnancy and STIs (sexually transmitted infections).  If told by your health care provider, take low-dose aspirin daily starting at age 65. What's next?  Visit your health care provider once a year for a well check visit.  Ask your health care provider how often you should have your eyes and teeth checked.  Stay up to date on all vaccines. This information is not intended to replace advice given to you by your health care provider. Make sure you discuss any questions you have with your health care provider. Document Revised: 06/30/2018 Document Reviewed:  06/30/2018 Elsevier Patient Education  [place].

## 2020-03-04 ENCOUNTER — Encounter: Payer: Self-pay | Admitting: Family Medicine

## 2020-03-04 ENCOUNTER — Encounter: Payer: Self-pay | Admitting: Internal Medicine

## 2020-03-04 ENCOUNTER — Other Ambulatory Visit: Payer: Self-pay

## 2020-03-04 ENCOUNTER — Ambulatory Visit (INDEPENDENT_AMBULATORY_CARE_PROVIDER_SITE_OTHER): Payer: BC Managed Care – PPO | Admitting: Family Medicine

## 2020-03-04 VITALS — BP 120/70 | HR 73 | Temp 96.8°F | Ht 67.0 in | Wt 377.4 lb

## 2020-03-04 DIAGNOSIS — E049 Nontoxic goiter, unspecified: Secondary | ICD-10-CM

## 2020-03-04 DIAGNOSIS — R7303 Prediabetes: Secondary | ICD-10-CM | POA: Diagnosis not present

## 2020-03-04 DIAGNOSIS — I10 Essential (primary) hypertension: Secondary | ICD-10-CM

## 2020-03-04 DIAGNOSIS — E7800 Pure hypercholesterolemia, unspecified: Secondary | ICD-10-CM | POA: Diagnosis not present

## 2020-03-04 DIAGNOSIS — Z23 Encounter for immunization: Secondary | ICD-10-CM

## 2020-03-04 DIAGNOSIS — Z1231 Encounter for screening mammogram for malignant neoplasm of breast: Secondary | ICD-10-CM

## 2020-03-04 DIAGNOSIS — R7989 Other specified abnormal findings of blood chemistry: Secondary | ICD-10-CM | POA: Diagnosis not present

## 2020-03-04 DIAGNOSIS — Z0000 Encounter for general adult medical examination without abnormal findings: Secondary | ICD-10-CM | POA: Diagnosis not present

## 2020-03-04 DIAGNOSIS — Z1211 Encounter for screening for malignant neoplasm of colon: Secondary | ICD-10-CM | POA: Diagnosis not present

## 2020-03-04 DIAGNOSIS — Z113 Encounter for screening for infections with a predominantly sexual mode of transmission: Secondary | ICD-10-CM

## 2020-03-05 ENCOUNTER — Encounter: Payer: Self-pay | Admitting: Family Medicine

## 2020-03-05 LAB — CBC WITH DIFFERENTIAL/PLATELET
Basophils Absolute: 0.1 10*3/uL (ref 0.0–0.2)
Basos: 1 %
EOS (ABSOLUTE): 0.1 10*3/uL (ref 0.0–0.4)
Eos: 1 %
Hematocrit: 41.5 % (ref 34.0–46.6)
Hemoglobin: 13.5 g/dL (ref 11.1–15.9)
Immature Grans (Abs): 0 10*3/uL (ref 0.0–0.1)
Immature Granulocytes: 0 %
Lymphocytes Absolute: 1.9 10*3/uL (ref 0.7–3.1)
Lymphs: 36 %
MCH: 28.2 pg (ref 26.6–33.0)
MCHC: 32.5 g/dL (ref 31.5–35.7)
MCV: 87 fL (ref 79–97)
Monocytes Absolute: 0.5 10*3/uL (ref 0.1–0.9)
Monocytes: 8 %
Neutrophils Absolute: 2.9 10*3/uL (ref 1.4–7.0)
Neutrophils: 54 %
Platelets: 223 10*3/uL (ref 150–450)
RBC: 4.79 x10E6/uL (ref 3.77–5.28)
RDW: 13.2 % (ref 11.7–15.4)
WBC: 5.4 10*3/uL (ref 3.4–10.8)

## 2020-03-05 LAB — COMPREHENSIVE METABOLIC PANEL
ALT: 11 IU/L (ref 0–32)
AST: 17 IU/L (ref 0–40)
Albumin/Globulin Ratio: 1.1 — ABNORMAL LOW (ref 1.2–2.2)
Albumin: 4.2 g/dL (ref 3.8–4.8)
Alkaline Phosphatase: 107 IU/L (ref 39–117)
BUN/Creatinine Ratio: 15 (ref 9–23)
BUN: 17 mg/dL (ref 6–24)
Bilirubin Total: 0.5 mg/dL (ref 0.0–1.2)
CO2: 26 mmol/L (ref 20–29)
Calcium: 9.2 mg/dL (ref 8.7–10.2)
Chloride: 99 mmol/L (ref 96–106)
Creatinine, Ser: 1.15 mg/dL — ABNORMAL HIGH (ref 0.57–1.00)
GFR calc Af Amer: 65 mL/min/{1.73_m2} (ref >59)
GFR calc non Af Amer: 57 mL/min/{1.73_m2} — ABNORMAL LOW (ref >59)
Globulin, Total: 3.9 g/dL (ref 1.5–4.5)
Glucose: 90 mg/dL (ref 65–99)
Potassium: 3.6 mmol/L (ref 3.5–5.2)
Sodium: 138 mmol/L (ref 134–144)
Total Protein: 8.1 g/dL (ref 6.0–8.5)

## 2020-03-05 LAB — LIPID PANEL
Chol/HDL Ratio: 3.6 ratio (ref 0.0–4.4)
Cholesterol, Total: 182 mg/dL (ref 100–199)
HDL: 51 mg/dL (ref >39)
LDL Chol Calc (NIH): 113 mg/dL — ABNORMAL HIGH (ref 0–99)
Triglycerides: 97 mg/dL (ref 0–149)
VLDL Cholesterol Cal: 18 mg/dL (ref 5–40)

## 2020-03-05 LAB — T4, FREE: Free T4: 1.56 ng/dL (ref 0.82–1.77)

## 2020-03-05 LAB — RPR: RPR Ser Ql: NONREACTIVE

## 2020-03-05 LAB — HEMOGLOBIN A1C
Est. average glucose Bld gHb Est-mCnc: 120 mg/dL
Hgb A1c MFr Bld: 5.8 % — ABNORMAL HIGH (ref 4.8–5.6)

## 2020-03-05 LAB — T3: T3, Total: 108 ng/dL (ref 71–180)

## 2020-03-05 LAB — TSH: TSH: 0.715 u[IU]/mL (ref 0.450–4.500)

## 2020-03-05 LAB — HIV ANTIBODY (ROUTINE TESTING W REFLEX): HIV Screen 4th Generation wRfx: NONREACTIVE

## 2020-03-06 LAB — GC/CHLAMYDIA PROBE AMP
Chlamydia trachomatis, NAA: NEGATIVE
Neisseria Gonorrhoeae by PCR: NEGATIVE

## 2020-03-06 NOTE — Progress Notes (Signed)
Her labs overall are fine. She is still in the pre diabetes range. LDL or bad cholesterol is mildly elevated but better than 5 months ago. Kidney function stable and keeping her blood pressure in goal range will help keep this from getting worse. Negative for sexually transmitted infections.

## 2020-03-08 ENCOUNTER — Encounter: Payer: Self-pay | Admitting: Internal Medicine

## 2020-03-13 ENCOUNTER — Other Ambulatory Visit: Payer: Self-pay

## 2020-03-13 ENCOUNTER — Ambulatory Visit (INDEPENDENT_AMBULATORY_CARE_PROVIDER_SITE_OTHER): Payer: BC Managed Care – PPO | Admitting: Allergy

## 2020-03-13 ENCOUNTER — Encounter: Payer: Self-pay | Admitting: Allergy

## 2020-03-13 VITALS — BP 128/70 | HR 88 | Temp 98.0°F | Resp 18 | Ht 67.0 in | Wt 375.2 lb

## 2020-03-13 DIAGNOSIS — J3089 Other allergic rhinitis: Secondary | ICD-10-CM | POA: Diagnosis not present

## 2020-03-13 NOTE — Progress Notes (Signed)
    Follow-up Note  RE: Cheryl Morrow MRN: [id number] DOB: 1971-08-24 Date of Office Visit: 03/13/2020   History of present illness: Cheryl Morrow is a 49 y.o. female presenting today for follow-up of allergic rhinitis and cough.  She was last seen in the office on 12/14/2019 by myself.  After her last visit she states she did see her primary and the lisinopril was stopped and changed to losartan.  She states when she got off of the lisinopril the cough resolved. She also states that her allergy regimen right now is working well for her.  She states she has been able to go outside and sit outside without any significant allergy symptoms.  She is taking RyVent 6 mg twice a day and using Astelin to help with her nasal drainage which helps.  At this time she is pleased with her allergy medication regimen.  Review of systems: Review of Systems  Constitutional: Negative.   HENT: Negative.   Eyes: Negative.   Respiratory: Negative.   Cardiovascular: Negative.   Gastrointestinal: Negative.   Musculoskeletal: Negative.   Skin: Negative.   Neurological: Negative.     All other systems negative unless noted above in HPI  Past medical/social/surgical/family history have been reviewed and are unchanged unless specifically indicated below.  No changes  Medication List: Current Outpatient Medications  Medication Sig Dispense Refill  . amLODipine (NORVASC) 10 MG tablet Take 1 tablet (10 mg total) by mouth daily. 90 tablet 2  . azelastine (ASTELIN) 0.1 % nasal spray Place 2 sprays into both nostrils 2 (two) times daily. 30 mL 5  . Carbinoxamine Maleate (RYVENT) 6 MG TABS Take 1 tablet by mouth 2 (two) times daily. 180 tablet 1  . losartan-hydrochlorothiazide (HYZAAR) 50-12.5 MG tablet TAKE 1 TABLET BY MOUTH DAILY (Patient taking differently: Take 1 tablet by mouth in the morning and at bedtime. ) 30 tablet 10  . potassium chloride (KLOR-CON) 10 MEQ tablet Take 1 tablet (10 mEq total) by mouth  daily. 30 tablet 12   No current facility-administered medications for this visit.     Known medication allergies: Allergies  Allergen Reactions  . Lisinopril Cough     Physical examination: Blood pressure 128/70, pulse 88, temperature 98 F (36.7 C), temperature source Temporal, resp. rate 18, height 5\' 7"  (1.702 m), weight (!) 375 lb 3.2 oz (170.2 kg), SpO2 98 %.  General: Alert, interactive, in no acute distress. HEENT: PERRLA, TMs pearly gray, turbinates non-edematous without discharge, post-pharynx non erythematous. Neck: Supple without lymphadenopathy. Lungs: Clear to auscultation without wheezing, rhonchi or rales. {no increased work of breathing. CV: Normal S1, S2 without murmurs. Abdomen: Nondistended, nontender. Skin: Warm and dry, without lesions or rashes. Extremities:  No clubbing, cyanosis or edema. Neuro:   Grossly intact.  Diagnositics/Labs: None today  Assessment and plan:   Allergic rhinitis - continue avoidance measures for grasses, weeds, trees, molds, cat, cockroach.  - continue nasal antihistamine, Astelin 2 sprays each nostril twice a day as needed for nasal drainage control.    - continue antihistamine, Ryvent 6mg  twice a day.   - if allergy medication regimen is not effective enough consider course of allergen immunotherapy  Follow-up in 6 months or sooner if needed  I appreciate the opportunity to take part in [place] care. Please do not hesitate to contact me with questions.  Sincerely,   Prudy Feeler, MD Allergy/Immunology [facility] [place] [facility]

## 2020-03-13 NOTE — Patient Instructions (Signed)
Allergic rhinitis - continue avoidance measures for grasses, weeds, trees, molds, cat, cockroach.  - continue nasal antihistamine, Astelin 2 sprays each nostril twice a day as needed for nasal drainage control.    - continue antihistamine, Ryvent 6mg  twice a day.   - if allergy medication regimen is not effective enough consider course of allergen immunotherapy  Follow-up in 6 months or sooner if needed

## 2020-03-22 ENCOUNTER — Other Ambulatory Visit: Payer: Self-pay

## 2020-03-22 ENCOUNTER — Ambulatory Visit
Admission: RE | Admit: 2020-03-22 | Discharge: 2020-03-22 | Disposition: A | Discharge status: Home / Self Care | Payer: BC Managed Care – PPO | Source: Ambulatory Visit | Attending: Family Medicine | Admitting: Family Medicine

## 2020-03-22 DIAGNOSIS — Z1231 Encounter for screening mammogram for malignant neoplasm of breast: Secondary | ICD-10-CM

## 2020-03-24 ENCOUNTER — Other Ambulatory Visit: Payer: Self-pay | Admitting: Cardiology

## 2020-03-25 NOTE — Telephone Encounter (Signed)
How does patient supposed to be taking Losartan, there are 2 different directions. The request state 1 tablet daily however on her chart it is noted 1 tablet in the morning and 1 tablet at bedtime. Please advise

## 2020-03-27 ENCOUNTER — Telehealth: Payer: Self-pay

## 2020-03-27 NOTE — Telephone Encounter (Signed)
I called the pt. To make sure that we did have scheduled on 06/05/20 at 11:45 for a 3 month f/u on thyroid gland I accidentally put another pt. At that time but he was moved to the correct schedule.

## 2020-03-28 ENCOUNTER — Telehealth: Payer: Self-pay | Admitting: Cardiology

## 2020-03-28 ENCOUNTER — Other Ambulatory Visit: Payer: Self-pay | Admitting: Cardiology

## 2020-03-28 ENCOUNTER — Other Ambulatory Visit: Payer: Self-pay

## 2020-03-28 MED ORDER — LOSARTAN POTASSIUM-HCTZ 50-12.5 MG PO TABS: 1.0000 | ORAL_TABLET | Freq: Two times a day (BID) | ORAL | 1 refills | Status: DC

## 2020-03-28 MED ORDER — AMLODIPINE BESYLATE 10 MG PO TABS: 10.0000 mg | ORAL_TABLET | Freq: Every day | ORAL | 2 refills | Status: DC

## 2020-03-28 NOTE — Telephone Encounter (Signed)
New Message   *STAT* If patient is at the pharmacy, call can be transferred to refill team.   1. Which medications need to be refilled? (please list name of each medication and dose if known) amLODipine (NORVASC) 10 MG tablet losartan-hydrochlorothiazide (HYZAAR) 50-12.5 MG tablet  2. Which pharmacy/location (including street and city if local pharmacy) is medication to be sent to? [id number] - [place], [place] - [address] AT [address]  3. Do they need a 30 day or 90 day supply? 90 day supply

## 2020-03-28 NOTE — Telephone Encounter (Signed)
New Message  Patient is calling in because she need prior authorization for medication Losartan and Amlodipine. Please assist.

## 2020-03-28 NOTE — Telephone Encounter (Signed)
Sent refills for patient.

## 2020-03-29 NOTE — Telephone Encounter (Signed)
Spoke to the patient just now and she informed me that she just needed a refill for her amlodipine and losartan. I let her know that these refills were sent in for her yesterday and that she should be able to pick them up from the pharmacy today. No other issues or concerns were noted at this time.    Encouraged patient to call back with any questions or concerns.

## 2020-04-04 ENCOUNTER — Other Ambulatory Visit (HOSPITAL_COMMUNITY): Payer: Self-pay | Admitting: General Surgery

## 2020-04-04 ENCOUNTER — Other Ambulatory Visit: Payer: Self-pay | Admitting: General Surgery

## 2020-04-05 ENCOUNTER — Other Ambulatory Visit: Payer: Self-pay

## 2020-04-05 ENCOUNTER — Telehealth: Payer: Self-pay | Admitting: *Deleted

## 2020-04-05 MED ORDER — CARBINOXAMINE MALEATE 6 MG PO TABS: 1.0000 | ORAL_TABLET | Freq: Two times a day (BID) | ORAL | 5 refills | Status: DC

## 2020-04-05 NOTE — Telephone Encounter (Signed)
Pts BMI is 59- weight 375.0lb- due to BMI and medical hx pt needs OV with dr Carlean Purl- she does have a [facility] in her father in his 41's- can see PA or Carlean Purl  [facility] Lelan Pons [facility]

## 2020-04-08 NOTE — Telephone Encounter (Signed)
No answer, left a message for the patient to call us back to make an OV.

## 2020-04-09 NOTE — Telephone Encounter (Signed)
Called patient, no answer, left a message for her to call us back to schedule an OV with Dr.Gessner. Also mailed a letter to the patient that states she needs an OV.

## 2020-04-12 ENCOUNTER — Other Ambulatory Visit: Payer: Self-pay

## 2020-04-12 ENCOUNTER — Ambulatory Visit (HOSPITAL_COMMUNITY)
Admission: RE | Admit: 2020-04-12 | Discharge: 2020-04-12 | Disposition: A | Discharge status: Home / Self Care | Payer: BC Managed Care – PPO | Source: Ambulatory Visit | Attending: General Surgery | Admitting: General Surgery

## 2020-04-19 ENCOUNTER — Ambulatory Visit (INDEPENDENT_AMBULATORY_CARE_PROVIDER_SITE_OTHER): Payer: BC Managed Care – PPO | Admitting: Cardiology

## 2020-04-19 ENCOUNTER — Encounter: Payer: Self-pay | Admitting: Cardiology

## 2020-04-19 ENCOUNTER — Other Ambulatory Visit: Payer: Self-pay

## 2020-04-19 VITALS — BP 114/78 | HR 84 | Ht 67.0 in | Wt 378.0 lb

## 2020-04-19 DIAGNOSIS — R7303 Prediabetes: Secondary | ICD-10-CM

## 2020-04-19 DIAGNOSIS — I10 Essential (primary) hypertension: Secondary | ICD-10-CM

## 2020-04-19 DIAGNOSIS — E7800 Pure hypercholesterolemia, unspecified: Secondary | ICD-10-CM

## 2020-04-19 DIAGNOSIS — I517 Cardiomegaly: Secondary | ICD-10-CM

## 2020-04-19 NOTE — Progress Notes (Signed)
Cardiology Office Note:    Date:  04/19/2020   ID:  Cheryl Morrow, DOB 1971-03-31, MRN [id number]  PCP:  Girtha Rm, NP-C  Cardiologist:  Jenne Campus, MD    Referring MD: Girtha Rm, NP-C   No chief complaint on file. M doing well  History of Present Illness:    Cheryl Morrow is a 49 y.o. female past medical history significant for atypical chest pain, stress test done showed no evidence of ischemia, essential hypertension, morbid obesity, prediabetes, dyslipidemia.  She comes today to my office for follow-up.  Overall she is doing well she cannot walk as much as she want because of her obesity.  She is being prepared for gastric sleeve surgery.  She said probably she will have surgery in August.  Denies have any chest pain tightness squeezing pressure burning chest overall she is doing well.  Past Medical History:  Diagnosis Date  . EKG abnormalities 09/15/2019  . Hypokalemia 09/17/2019  . Mild cardiomegaly 09/15/2019  . Morbid obesity ([place]) 09/15/2019  . Prediabetes 09/17/2019  . Sickle cell trait ([place])   . Uncontrolled hypertension 09/15/2019    Past Surgical History:  Procedure Laterality Date  . HYSTERECTOMY ABDOMINAL WITH SALPINGECTOMY      Current Medications: Current Meds  Medication Sig  . amLODipine (NORVASC) 10 MG tablet Take 1 tablet (10 mg total) by mouth daily.  Marland Kitchen azelastine (ASTELIN) 0.1 % nasal spray Place 2 sprays into both nostrils 2 (two) times daily.  . Carbinoxamine Maleate (RYVENT) 6 MG TABS Take 1 tablet by mouth 2 (two) times daily.  . famotidine (PEPCID) 20 MG tablet Take 20 mg by mouth 2 (two) times daily.  Marland Kitchen losartan-hydrochlorothiazide (HYZAAR) 50-12.5 MG tablet Take 1 tablet by mouth in the morning and at bedtime.     Allergies:   Lisinopril   Social History   Socioeconomic History  . Marital status: Single    Spouse name: Not on file  . Number of children: Not on file  . Years of education: Not on file  . Highest  education level: Not on file  Occupational History  . Not on file  Tobacco Use  . Smoking status: Never Smoker  . Smokeless tobacco: Never Used  Vaping Use  . Vaping Use: Never used  Substance and Sexual Activity  . Alcohol use: Yes    Comment: occasionally  . Drug use: Never  . Sexual activity: Yes  Other Topics Concern  . Not on file  Social History Narrative  . Not on file   Social Determinants of Health   Financial Resource Strain:   . Difficulty of Paying Living Expenses:   Food Insecurity:   . Worried About Charity fundraiser in the Last Year:   . Arboriculturist in the Last Year:   Transportation Needs:   . Film/video editor (Medical):   Marland Kitchen Lack of Transportation (Non-Medical):   Physical Activity:   . Days of Exercise per Week:   . Minutes of Exercise per Session:   Stress:   . Feeling of Stress :   Social Connections:   . Frequency of Communication with Friends and Family:   . Frequency of Social Gatherings with Friends and Family:   . Attends Religious Services:   . Active Member of Clubs or Organizations:   . Attends Archivist Meetings:   Marland Kitchen Marital Status:      Family History: The patient's family history includes Arthritis in her mother; Colon  cancer in her father; Hypertension in her father, mother, and sister; Other in her brother; Sickle cell trait in her brother. There is no history of Allergic rhinitis, Angioedema, Asthma, Eczema, Atopy, Immunodeficiency, or Urticaria. ROS:   Please see the history of present illness.    All 14 point review of systems negative except as described per history of present illness  EKGs/Labs/Other Studies Reviewed:      Recent Labs: 09/15/2019: BNP 54.7 09/19/2019: Magnesium 2.2 03/04/2020: ALT 11; BUN 17; Creatinine, Ser 1.15; Hemoglobin 13.5; Platelets 223; Potassium 3.6; Sodium 138; TSH 0.715  Recent Lipid Panel    Component Value Date/Time   CHOL 182 03/04/2020 1145   TRIG 97 03/04/2020 1145     HDL 51 03/04/2020 1145   CHOLHDL 3.6 03/04/2020 1145   LDLCALC 113 (H) 03/04/2020 1145    Physical Exam:    VS:  BP 114/78   Pulse 84   Ht 5\' 7"  (1.702 m)   Wt (!) 378 lb (171.5 kg)   SpO2 98%   BMI 59.20 kg/m     Wt Readings from Last 3 Encounters:  04/19/20 (!) 378 lb (171.5 kg)  03/13/20 (!) 375 lb 3.2 oz (170.2 kg)  03/04/20 (!) 377 lb 6.4 oz (171.2 kg)     GEN:  Well nourished, well developed in no acute distress HEENT: Normal NECK: No JVD; No carotid bruits LYMPHATICS: No lymphadenopathy CARDIAC: RRR, no murmurs, no rubs, no gallops RESPIRATORY:  Clear to auscultation without rales, wheezing or rhonchi  ABDOMEN: Soft, non-tender, non-distended MUSCULOSKELETAL:  No edema; No deformity  SKIN: Warm and dry LOWER EXTREMITIES: no swelling NEUROLOGIC:  Alert and oriented x 3 PSYCHIATRIC:  Normal affect   ASSESSMENT:    1. Essential hypertension   2. Mild cardiomegaly   3. Prediabetes   4. Morbid obesity ([place])   5. Elevated LDL cholesterol level    PLAN:    In order of problems listed above:  1. Essential hypertension blood pressure well controlled continue present management. 2. Mild cardiomegaly on x-ray, however echocardiogram did not confirm that.  Interesting her echocardiogram showed some basal inferior hypokinesis with ejection fraction 55 to 5%.  She is on ARB which I will continue. 3. Prediabetes.  Hopefully it will get better after surgery to reduce weight 4. Elevated LDL.  We did calculate her 10-year risk which came as 1.7%.  Therefore there is no indication to start statin.  I also hope that with surgery for rate reduction she will lose significant amount of weight and overall her metabolic profile became much better.  I also hope that she will be able to exercise more after that. 5. Her stress test reviewed again ejection fraction 48%, she is already on ARB which I will continue.  We will repeat echocardiogram sometimes around December to see if we  need to maximize therapy. 6. In terms of her weight reduction surgery.  She can have it done from my standpoint reviewed.  With reasonable risks.   Medication Adjustments/Labs and Tests Ordered: Current medicines are reviewed at length with the patient today.  Concerns regarding medicines are outlined above.  No orders of the defined types were placed in this encounter.  Medication changes: No orders of the defined types were placed in this encounter.   Signed, Park Liter, MD, [facility] 04/19/2020 9:51 AM    [place]

## 2020-04-19 NOTE — Patient Instructions (Signed)
Medication Instructions:  °Your physician recommends that you continue on your current medications as directed. Please refer to the Current Medication list given to you today. ° °*If you need a refill on your cardiac medications before your next appointment, please call your pharmacy* ° ° °Lab Work: °None. ° °If you have labs (blood work) drawn today and your tests are completely normal, you will receive your results only by: °• MyChart Message (if you have MyChart) OR °• A paper copy in the mail °If you have any lab test that is abnormal or we need to change your treatment, we will call you to review the results. ° ° °Testing/Procedures: °Your physician has requested that you have an echocardiogram. Echocardiography is a painless test that uses sound waves to create images of your heart. It provides your doctor with information about the size and shape of your heart and how well your heart’s chambers and valves are working. This procedure takes approximately one hour. There are no restrictions for this procedure. ° ° ° ° °Follow-Up: °At [facility], you and your health needs are our priority.  As part of our continuing mission to provide you with exceptional heart care, we have created designated Provider Care Teams.  These Care Teams include your primary Cardiologist (physician) and Advanced Practice Providers (APPs -  Physician Assistants and Nurse Practitioners) who all work together to provide you with the care you need, when you need it. ° °We recommend signing up for the patient portal called "MyChart".  Sign up information is provided on this After Visit Summary.  MyChart is used to connect with patients for Virtual Visits (Telemedicine).  Patients are able to view lab/test results, encounter notes, upcoming appointments, etc.  Non-urgent messages can be sent to your provider as well.   °To learn more about what you can do with MyChart, go to [email or web address].   ° °Your next appointment:   °6  month(s) ° °The format for your next appointment:   °In Person ° °Provider:   °Robert Krasowski, MD ° ° °Other Instructions ° ° °Echocardiogram °An echocardiogram is a procedure that uses painless sound waves (ultrasound) to produce an image of the heart. Images from an echocardiogram can provide important information about: °· Signs of coronary artery disease (CAD). °· Aneurysm detection. An aneurysm is a weak or damaged part of an artery wall that bulges out from the normal force of blood pumping through the body. °· Heart size and shape. Changes in the size or shape of the heart can be associated with certain conditions, including heart failure, aneurysm, and CAD. °· Heart muscle function. °· Heart valve function. °· Signs of a past heart attack. °· Fluid buildup around the heart. °· Thickening of the heart muscle. °· A tumor or infectious growth around the heart valves. °Tell a health care provider about: °· Any allergies you have. °· All medicines you are taking, including vitamins, herbs, eye drops, creams, and over-the-counter medicines. °· Any blood disorders you have. °· Any surgeries you have had. °· Any medical conditions you have. °· Whether you are pregnant or may be pregnant. °What are the risks? °Generally, this is a safe procedure. However, problems may occur, including: °· Allergic reaction to dye (contrast) that may be used during the procedure. °What happens before the procedure? °No specific preparation is needed. You may eat and drink normally. °What happens during the procedure? ° °· An IV tube may be inserted into one of your veins. °· You may   receive contrast through this tube. A contrast is an injection that improves the quality of the pictures from your heart. °· A gel will be applied to your chest. °· A wand-like tool (transducer) will be moved over your chest. The gel will help to transmit the sound waves from the transducer. °· The sound waves will harmlessly bounce off of your heart to  allow the heart images to be captured in real-time motion. The images will be recorded on a computer. °The procedure may vary among health care providers and hospitals. °What happens after the procedure? °· You may return to your normal, everyday life, including diet, activities, and medicines, unless your health care provider tells you not to do that. °Summary °· An echocardiogram is a procedure that uses painless sound waves (ultrasound) to produce an image of the heart. °· Images from an echocardiogram can provide important information about the size and shape of your heart, heart muscle function, heart valve function, and fluid buildup around your heart. °· You do not need to do anything to prepare before this procedure. You may eat and drink normally. °· After the echocardiogram is completed, you may return to your normal, everyday life, unless your health care provider tells you not to do that. °This information is not intended to replace advice given to you by your health care provider. Make sure you discuss any questions you have with your health care provider. °Document Revised: 02/09/2019 Document Reviewed: 11/21/2016 °Elsevier Patient Education © 2020 Elsevier Inc. ° ° °

## 2020-04-19 NOTE — Addendum Note (Signed)
Addended by: Linna Hoff R on: 04/19/2020 10:00 AM   Modules accepted: Orders

## 2020-04-26 ENCOUNTER — Encounter: Payer: BC Managed Care – PPO | Admitting: Internal Medicine

## 2020-05-03 ENCOUNTER — Other Ambulatory Visit (HOSPITAL_BASED_OUTPATIENT_CLINIC_OR_DEPARTMENT_OTHER): Payer: BC Managed Care – PPO

## 2020-05-15 ENCOUNTER — Encounter: Payer: BC Managed Care – PPO | Attending: General Surgery | Admitting: Skilled Nursing Facility1

## 2020-05-15 ENCOUNTER — Other Ambulatory Visit: Payer: Self-pay

## 2020-05-15 ENCOUNTER — Encounter: Payer: Self-pay | Admitting: Skilled Nursing Facility1

## 2020-05-15 DIAGNOSIS — E669 Obesity, unspecified: Secondary | ICD-10-CM | POA: Insufficient documentation

## 2020-05-15 NOTE — Progress Notes (Signed)
Nutrition Assessment for Bariatric Surgery Medical Nutrition Therapy Appt Start Time:   8:10 End Time:  9:20  Patient was seen on 05/15/2020 for Pre-Operative Nutrition Assessment. Letter of approval faxed to [facility] bariatric surgery program coordinator on 05/15/2020  Referral stated Supervised Weight Loss (SWL) visits needed: 0  Planned surgery: sleeve gastrectomy  Pt expectation of surgery: to lose weight Pt expectation of dietitian: none stated    NUTRITION ASSESSMENT   Anthropometrics  Start weight at [facility]: 380.7 lbs (date: 07/014/2021)  Height: 67 in BMI: 59.63 kg/m2     Clinical  Medical hx: HTN Medications:  Labs: A1C 5.8; hx of creatinine being higher more recent 1.15 Notable signs/symptoms: knee pain Any previous deficiencies? fluctuations in potassium  Micronutrient Nutrition Focused Physical Exam: Hair: No issues observed Eyes: No issues observed Mouth: No issues observed Neck: No issues observed Nails: No issues observed Skin: No issues observed  Lifestyle & Dietary Hx  Pt state she realizes she needs to grocery shop more often.  Pt states she is an emotional eater.  Pt has a good understanding of changes to implement to be successful with surgery.   24-Hr Dietary Recall First Meal: fast food Snack: fruit Second Meal: salad out Snack: nuts Third Meal: fast food Snack:  Beverages: water, juices, lemonade, tea   Estimated Energy Needs Calories: 1600 Carbohydrate: 180g Protein: 120g Fat: 44g   NUTRITION DIAGNOSIS  Overweight/obesity ([place]-3.3) related to past poor dietary habits and physical inactivity as evidenced by patient w/ planned sleeve gastrectomy surgery following dietary guidelines for continued weight loss.    NUTRITION INTERVENTION  Nutrition counseling (C-1) and education (E-2) to facilitate bariatric surgery goals.   Pre-Op Goals Reviewed with the Patient . Track food and beverage intake (pen and paper, MyFitness  Pal, Baritastic app, etc.) . Make healthy food choices while monitoring portion sizes . Consume 3 meals per day or try to eat every 3-5 hours . Avoid concentrated sugars and fried foods . Keep sugar & fat in the single digits per serving on food labels . Practice CHEWING your food (aim for applesauce consistency) . Practice not drinking 15 minutes before, during, and 30 minutes after each meal and snack . Avoid all carbonated beverages (ex: soda, sparkling beverages)  . Limit caffeinated beverages (ex: coffee, tea, energy drinks) . Avoid all sugar-sweetened beverages (ex: regular soda, sports drinks)  . Avoid alcohol  . Aim for 64-100 ounces of FLUID daily (with at least half of fluid intake being plain water)  . Aim for at least 60-80 grams of PROTEIN daily . Look for a liquid protein source that contains ?15 g protein and ?5 g carbohydrate (ex: shakes, drinks, shots) . Make a list of non-food related activities . Physical activity is an important part of a healthy lifestyle so keep it moving! The goal is to reach 150 minutes of exercise per week, including cardiovascular and weight baring activity.  *Goals that are bolded indicate the pt would like to start working towards these  Handouts Provided Include  . Bariatric Surgery handouts (Nutrition Visits, Pre-Op Goals, Protein Shakes, Vitamins & Minerals) . Detailed MyPlate . Hobby sheet . Mindful Meal check list  Learning Style & Readiness for Change Teaching method utilized: Visual & Auditory  Demonstrated degree of understanding via: Teach Back  Barriers to learning/adherence to lifestyle change: emotional eater  RD's Notes for Next Visit . Assess pts mindful meal check list     MONITORING & EVALUATION Dietary intake, weekly physical activity,  body weight, and pre-op goals reached at next nutrition visit.    Next Steps  Patient is to follow up at [place] for Pre-Op Class >2 weeks before surgery for further nutrition education.

## 2020-05-27 ENCOUNTER — Ambulatory Visit: Payer: BC Managed Care – PPO | Admitting: Physician Assistant

## 2020-05-29 ENCOUNTER — Other Ambulatory Visit: Payer: Self-pay

## 2020-05-29 ENCOUNTER — Encounter: Payer: BC Managed Care – PPO | Admitting: Skilled Nursing Facility1

## 2020-05-29 DIAGNOSIS — E669 Obesity, unspecified: Secondary | ICD-10-CM | POA: Diagnosis not present

## 2020-05-29 NOTE — Progress Notes (Signed)
Supervised Weight Loss Visit Bariatric Nutrition Education  Planned Surgery: sleeve  Star given previously: no  NUTRITION ASSESSMENT  Anthropometrics  Start weight at [facility]: 380.7 lbs (date: 07/014/2021)  Weight today: 379 pounds Height: 67 in BMI: 59.49 kg/m2    Clinical  Medical Hx:  Medications: Labs:  Notable Signs/Symptoms:   Lifestyle & Dietary Hx  Pt states she has increased water using a reminder app.  Pt states she is trying to regulate her work schedule with a lot of people having quit.  Pt states she is more conscious about emotional eating.   Pt is very aware of how she eats and is controlling her emotional eating. Pt is very open to change and doing very well.   Estimated daily fluid intake:  oz Supplements:  Current average weekly physical activity:   24-Hr Dietary Recall First Meal:  Snack:  Second Meal:  Snack:  Third Meal:  Snack:  Beverages:   Estimated Energy Needs Calories: 1600   NUTRITION DIAGNOSIS  Overweight/obesity ([place]-3.3) related to past poor dietary habits and physical inactivity as evidenced by patient w/ planned sleeve surgery following dietary guidelines for continued weight loss.   NUTRITION INTERVENTION  Nutrition counseling (C-1) and education (E-2) to facilitate bariatric surgery goals.  Pre-Op Goals Progress & New Goals    Handouts Provided Include     Learning Style & Readiness for Change Teaching method utilized: Visual & Auditory  Demonstrated degree of understanding via: Teach Back  Barriers to learning/adherence to lifestyle change: none identified   RD's Notes for next Visit      MONITORING & EVALUATION Dietary intake, weekly physical activity, body weight, and pre-op goals in 1 month.   Next Steps  Patient is to return to [facility]

## 2020-06-05 ENCOUNTER — Ambulatory Visit: Payer: BC Managed Care – PPO | Admitting: Family Medicine

## 2020-06-19 ENCOUNTER — Other Ambulatory Visit: Payer: Self-pay | Admitting: Cardiology

## 2020-06-26 ENCOUNTER — Telehealth: Payer: Self-pay

## 2020-06-26 NOTE — Telephone Encounter (Signed)
Left message to call back  Kerin Ransom PA-C 06/26/2020 3:05 PM

## 2020-06-26 NOTE — Telephone Encounter (Signed)
   [facility] Pre-operative Risk Assessment    HEARTCARE STAFF: - Please ensure there is not already an duplicate clearance open for this procedure. - Under Visit Info/Reason for Call, type in Other and utilize the format Clearance MM/DD/YY or Clearance TBD. Do not use dashes or single digits. - If request is for dental extraction, please clarify the # of teeth to be extracted.  Request for surgical clearance:  1. What type of surgery is being performed? Bariatric Surgery   2. When is this surgery scheduled? TBD   3. What type of clearance is required (medical clearance vs. Pharmacy clearance to hold med vs. Both)? Medical  4. Are there any medications that need to be held prior to surgery and how long?   5. Practice name and name of physician performing surgery? [facility]   6. What is the office phone number? [phone]   7.   What is the office fax number? [phone]  8.   Anesthesia type (None, local, MAC, general) ? General   Lowella Grip 06/26/2020, 12:36 PM  _________________________________________________________________   (provider comments below)

## 2020-06-27 NOTE — Telephone Encounter (Signed)
   Primary Cardiologist: Jenne Campus, MD  Chart reviewed as part of pre-operative protocol coverage. Patient was contacted 06/27/2020 in reference to pre-operative risk assessment for pending surgery as outlined below.  Cheryl Morrow was last seen on 04/19/20 by Agustin Cree.  Since that day, Cheryl Morrow has done well. Dr. Agustin Cree cleared her for surgery at that appt. Since that time, she denies any new anginal symptoms, no dyspnea. She can complete more than 4.0 METS (walks 1-2 miles, climbs stairs).  Therefore, based on ACC/AHA guidelines, the patient would be at acceptable risk for the planned procedure without further cardiovascular testing.   I will route this recommendation to the requesting party via Epic fax function and remove from pre-op pool. Please call with questions.  Tami Lin Alvino Lechuga, PA 06/27/2020, 10:56 AM

## 2020-07-05 ENCOUNTER — Other Ambulatory Visit: Payer: Self-pay

## 2020-07-05 MED ORDER — CARBINOXAMINE MALEATE 6 MG PO TABS: 1.0000 | ORAL_TABLET | Freq: Two times a day (BID) | ORAL | 0 refills | Status: DC

## 2020-08-30 ENCOUNTER — Other Ambulatory Visit: Payer: Self-pay

## 2020-08-30 MED ORDER — CARBINOXAMINE MALEATE 6 MG PO TABS: 1.0000 | ORAL_TABLET | Freq: Two times a day (BID) | ORAL | 0 refills | Status: DC

## 2020-09-04 ENCOUNTER — Ambulatory Visit: Payer: Self-pay | Admitting: General Surgery

## 2020-09-04 NOTE — H&P (View-Only) (Signed)
Cheryl Morrow Appointment: 09/04/2020 10:15 AM Location: [facility] Patient #: [id number] DOB: 03-Mar-1971 Single / Language: [name] / Race: Black or African American Female  History of Present Illness Randall Hiss M. Kariel Skillman MD; 09/04/2020 11:16 AM) The patient is a 49 year old female who presents for a bariatric surgery evaluation. She comes in today for additional follow-up regarding her severe obesity. She is completed the preoperative evaluation phase. She denies any medical changes since I initially saw her. She denies any chest pain, chest pressure, dyspnea on exertion, source of breath. She denies any heartburn, trouble swallowing, GERD. She denies any trips to the emergency room hospital. She denies any tobacco use. She saw cardiology and received clearance. Upper GI showed presbyesophagus. Chest x-ray unremarkable. Bariatric evaluation labs were essentially normal except for A1c of 5.8, total cholesterol 208, LDL 139 and creatinine of around 1.2. She states that she is been working out with a Physiological scientist for 5 weeks. She does 3-4 times a week. She is mainly doing sit ups, resistant bands and weight training and stationary bike   03/07/20 She is referred by Dr Harland Dingwall at [facility] in [place] for evaluation of weight loss surgery. She is interested in the sleeve gastrectomy. She is not interested in adjustable gastric band. She has had friends who have had sleeve gastrectomy's as well as lap bands. She believes a sleeve gastrectomy would be more appropriate for her. She states that she is able to lose weight but unfortunately she regains it back. She is interested in improving her overall health and having less joint pain. Despite numerous attempts for sustained weight loss she has been unsuccessful. She has tried intermittent fasting, low calorie diets, low carbohydrate diets as well as a weight loss clinic in [place] where she was given a  prescription for phentermine and beta hCG-all without any long-term success.Her comorbidities include hypertension, right knee osteoarthritis, and some cardiac history but no MI.  She denies any chest pain, chest pressure, shortness of breath, dyspnea on exertion, orthopnea, paroxysmal nocturnal dyspnea. She denies any peripheral edema. She denies any personal family history of blood clots. She states that she saw a cardiologist last year due to some fluid around her heart. She had a chest x-ray in November 2020 which showed some mild cardiac enlargement. This was evaluated by cardiology in November. I reviewed Dr. Julien Nordmann note. She underwent an echocardiogram in December 2020 which I reviewed which showed an ejection fraction of 50 to 55% and mild LA dilatation. This was followed by a nuclear medicine scan which I also reviewed which showed a stress ejection fraction of 48%, mild global hypokinesis and was felt to be an intermediate risk study.  She denies any reflux, heartburn or indigestion. She denies any trouble swallowing liquids or solids. She has a daily bowel movement. No melena or hematochezia. Her sleep apnea questionnaire was low risk. She states a few years ago she had a chronic cough and was ultimately found to have allergies and that has resolved. She had a hysterectomy due to fibroids. She denies any dysuria or hematuria. She has fairly chronic right knee pain which is required injections. She is a prediabetic with an A1c of 5.7 from November 2020. She denies any blurry vision, TIAs or amaurosis fugax. She denies any migraines.  She denies any tobacco or drug use. She drinks alcohol socially. She works for [employer] [name], MD; 09/04/2020 11:16 AM) OTHER FATIGUE (  R53.83) KNEE OSTEOMYELITIS, LEFT (M86.9) VENTRICULAR HYPOKINESIS (I51.89) PREDIABETES (R73.03) SEVERE OBESITY (E66.01) I believe the patient meets weight  loss surgery criteria. HYPERCHOLESTEREMIA (E78.00)  Past Surgical History [name], MD; 09/04/2020 11:16 AM) Hysterectomy (not due to cancer) - Complete  Diagnostic Studies History [name], MD; 09/04/2020 11:16 AM) Colonoscopy never Mammogram 1-3 years ago Pap Smear 1-5 years ago  Allergies ([name], [place]; 09/04/2020 10:34 AM) Lisinopril *ANTIHYPERTENSIVES* Allergies Reconciled  Medication History ([name], RMA; 09/04/2020 10:35 AM) Losartan Potassium-HCTZ (50-12.5MG  Tablet, Oral) Active. RyVent (6MG  Tablet, Oral) Active. Medications Reconciled  Social History [name], MD; 09/04/2020 11:16 AM) Alcohol use Occasional alcohol use. Caffeine use Tea. Illicit drug use Recently quit drug use. Tobacco use Former smoker.  Family History [name], MD; 09/04/2020 11:16 AM) Alcohol Abuse Father. Arthritis Father, Mother. Colon Cancer Brother, Father. Hypertension Mother, Sister.  Pregnancy / Birth History [name], MD; 09/04/2020 11:16 AM) Age at menarche 52 years. Contraceptive History Depo-provera, Oral contraceptives. Gravida 3 Irregular periods Maternal age 62-25 Para 39  Other Problems [name], MD; 09/04/2020 11:16 AM) Arthritis Transfusion history ESSENTIAL HYPERTENSION (I10)     Review of Systems Randall Hiss M. Evon Dejarnett MD; 09/04/2020 11:14 AM) All other systems negative  Vitals ([name] RMA; 09/04/2020 10:35 AM) 09/04/2020 10:35 AM Weight: 391.2 lb Height: 66in Body Surface Area: 2.66 m Body Mass Index: 63.14 kg/m  Temp.: 97.39F  Pulse: 86 (Regular)  BP: 134/86(Sitting, Left Arm, Standard)        Physical Exam Randall Hiss M. Tinita Brooker MD; 09/04/2020 11:14 AM)  The physical exam findings are as follows: Note:severe obesity; evenly distributed  General Mental Status-Alert. General Appearance-Consistent with stated age. Hydration-Well  hydrated. Voice-Normal.  Head and Neck Head-normocephalic, atraumatic with no lesions or palpable masses. Trachea-midline. Thyroid Gland Characteristics - normal size and consistency.  Eye Eyeball - Bilateral-Extraocular movements intact. Sclera/Conjunctiva - Bilateral-No scleral icterus.  ENMT Ears Pinna - Bilateral - no bony growth in lateral aspect of ear canal, no edema. Nose and Sinuses External Inspection of the Nose - symmetric, no deformities observed. Mouth and Throat -Note:lips intact.   Chest and Lung Exam Chest and lung exam reveals -quiet, even and easy respiratory effort with no use of accessory muscles and on auscultation, normal breath sounds, no adventitious sounds and normal vocal resonance. Inspection Chest Wall - Normal. Back - normal.  Breast - Did not examine.  Cardiovascular Cardiovascular examination reveals -normal heart sounds, regular rate and rhythm with no murmurs and normal pedal pulses bilaterally.  Abdomen Inspection Inspection of the abdomen reveals - No Hernias. Skin - Scar - Note: old hysterectomy scar. Palpation/Percussion Palpation and Percussion of the abdomen reveal - Soft, Non Tender, No Rebound tenderness, No Rigidity (guarding) and No hepatosplenomegaly. Auscultation Auscultation of the abdomen reveals - Bowel sounds normal.  Peripheral Vascular Upper Extremity Palpation - Pulses bilaterally normal.  Neurologic Neurologic evaluation reveals -alert and oriented x 3 with no impairment of recent or remote memory. Mental Status-Normal.  Neuropsychiatric The patient's mood and affect are described as -normal. Judgment and Insight-insight is appropriate concerning matters relevant to self.  Musculoskeletal Normal Exam - Left-Upper Extremity Strength Normal and Lower Extremity Strength Normal. Normal Exam - Right-Upper Extremity Strength Normal and Lower Extremity Strength Normal. Note: bilateral  knee crepitus  Lymphatic Head & Neck  General Head & Neck Lymphatics: Bilateral - Description - Normal. Axillary - Did not examine. Femoral & Inguinal - Did not examine.  Assessment & Plan Randall Hiss M. Dejai Schubach MD; 09/04/2020 11:14 AM)  SEVERE OBESITY (E66.01) Story: I believe the patient meets weight loss surgery criteria. Impression: The patient meets weight loss surgery criteria. I think the patient is an acceptable candidate for Laparoscopic vertical sleeve gastrectomy.  We rediscussed LSG. We discussed the preoperative, postoperative process. We reviewed her workup. We discussed her labs. We discussed the typical hospitalization. I discussed the importance of the preoperative meal plan. She has her preoperative education classes next week. I congratulated her on her recent physical activity and weight training. All of her questions were asked and answered  We rediscussed that before and after surgery that there would be an alteration in their diet. I explained that we have put them on a diet 2 weeks before surgery. I also explained that they would be on a liquid diet for 2 weeks after surgery. We discussed that they would have to avoid certain foods after surgery. We discussed the importance of physical activity as well as compliance with our dietary and supplement recommendations and routine follow-up.  This patient encounter took 28 minutes today to perform the following: take history, perform exam, review outside records, interpret imaging, counsel the patient on their diagnosis and document encounter, findings & plan in the EHR  Current Plans Pt Education - EMW_preopbariatric  KNEE OSTEOMYELITIS, LEFT (M86.9)   OTHER FATIGUE (R53.83)   PREDIABETES (R73.03)   ESSENTIAL HYPERTENSION (I10)   HYPERCHOLESTEREMIA (E78.00)  Leighton Ruff. Redmond Pulling, MD, FACS General, Bariatric, & Minimally Invasive Surgery [facility], [place]

## 2020-09-04 NOTE — H&P (Signed)
Cheryl Morrow Appointment: 09/04/2020 10:15 AM Location: [facility] Patient #: [id number] DOB: 1971/08/26 Single / Language: Cheryl Morrow / Race: Black or African American Female  History of Present Illness Cheryl Hiss M. Jirah Rider MD; 09/04/2020 11:16 AM) The patient is a 49 year old female who presents for a bariatric surgery evaluation. She comes in today for additional follow-up regarding her severe obesity. She is completed the preoperative evaluation phase. She denies any medical changes since I initially saw her. She denies any chest pain, chest pressure, dyspnea on exertion, source of breath. She denies any heartburn, trouble swallowing, GERD. She denies any trips to the emergency room hospital. She denies any tobacco use. She saw cardiology and received clearance. Upper GI showed presbyesophagus. Chest x-ray unremarkable. Bariatric evaluation labs were essentially normal except for A1c of 5.8, total cholesterol 208, LDL 139 and creatinine of around 1.2. She states that she is been working out with a Cheryl Morrow for 5 weeks. She does 3-4 times a week. She is mainly doing sit ups, resistant bands and weight training and stationary bike   03/07/20 She is referred by Cheryl Morrow at [facility] in [place] for evaluation of weight loss surgery. She is interested in the sleeve gastrectomy. She is not interested in adjustable gastric band. She has had friends who have had sleeve gastrectomy's as well as lap bands. She believes a sleeve gastrectomy would be more appropriate for her. She states that she is able to lose weight but unfortunately she regains it back. She is interested in improving her overall health and having less joint pain. Despite numerous attempts for sustained weight loss she has been unsuccessful. She has tried intermittent fasting, low calorie diets, low carbohydrate diets as well as a weight loss clinic in [place] where she was given a  prescription for phentermine and beta hCG-all without any long-term success.Her comorbidities include hypertension, right knee osteoarthritis, and some cardiac history but no MI.  She denies any chest pain, chest pressure, shortness of breath, dyspnea on exertion, orthopnea, paroxysmal nocturnal dyspnea. She denies any peripheral edema. She denies any personal family history of blood clots. She states that she saw a cardiologist last year due to some fluid around her heart. She had a chest x-ray in November 2020 which showed some mild cardiac enlargement. This was evaluated by cardiology in November. I reviewed Cheryl. Julien Morrow note. She underwent an echocardiogram in December 2020 which I reviewed which showed an ejection fraction of 50 to 55% and mild LA dilatation. This was followed by a nuclear medicine scan which I also reviewed which showed a stress ejection fraction of 48%, mild global hypokinesis and was felt to be an intermediate risk study.  She denies any reflux, heartburn or indigestion. She denies any trouble swallowing liquids or solids. She has a daily bowel movement. No melena or hematochezia. Her sleep apnea questionnaire was low risk. She states a few years ago she had a chronic cough and was ultimately found to have allergies and that has resolved. She had a hysterectomy due to fibroids. She denies any dysuria or hematuria. She has fairly chronic right knee pain which is required injections. She is a prediabetic with an A1c of 5.7 from November 2020. She denies any blurry vision, TIAs or amaurosis fugax. She denies any migraines.  She denies any tobacco or drug use. She drinks alcohol socially. She works for [employer] Cheryl Hiss M. Redmond Pulling, MD; 09/04/2020 11:16 AM) OTHER FATIGUE (  R53.83) KNEE OSTEOMYELITIS, LEFT (M86.9) VENTRICULAR HYPOKINESIS (I51.89) PREDIABETES (R73.03) SEVERE OBESITY (E66.01) I believe the patient meets weight  loss surgery criteria. HYPERCHOLESTEREMIA (E78.00)  Past Surgical History Cheryl Hiss M. Redmond Pulling, MD; 09/04/2020 11:16 AM) Hysterectomy (not due to cancer) - Complete  Diagnostic Studies History Cheryl Hiss M. Redmond Pulling, MD; 09/04/2020 11:16 AM) Colonoscopy never Mammogram 1-3 years ago Pap Smear 1-5 years ago  Allergies (Cheryl Morrow, Cheryl Morrow; 09/04/2020 10:34 AM) Lisinopril *ANTIHYPERTENSIVES* Allergies Reconciled  Medication History (Cheryl Morrow, RMA; 09/04/2020 10:35 AM) Losartan Potassium-HCTZ (50-12.5MG  Tablet, Oral) Active. RyVent (6MG  Tablet, Oral) Active. Medications Reconciled  Social History Cheryl Hiss M. Redmond Pulling, MD; 09/04/2020 11:16 AM) Alcohol use Occasional alcohol use. Caffeine use Tea. Illicit drug use Recently quit drug use. Tobacco use Former smoker.  Family History Cheryl Hiss M. Redmond Pulling, MD; 09/04/2020 11:16 AM) Alcohol Abuse Father. Arthritis Father, Mother. Colon Cancer Brother, Father. Hypertension Mother, Sister.  Pregnancy / Birth History Cheryl Hiss M. Redmond Pulling, MD; 09/04/2020 11:16 AM) Age at menarche 63 years. Contraceptive History Depo-provera, Oral contraceptives. Gravida 3 Irregular periods Maternal age 45-25 Para 27  Other Problems Cheryl Hiss M. Redmond Pulling, MD; 09/04/2020 11:16 AM) Arthritis Transfusion history ESSENTIAL HYPERTENSION (I10)     Review of Systems Cheryl Hiss M. Keval Nam MD; 09/04/2020 11:14 AM) All other systems negative  Vitals (Cheryl Morrow RMA; 09/04/2020 10:35 AM) 09/04/2020 10:35 AM Weight: 391.2 lb Height: 66in Body Surface Area: 2.66 m Body Mass Index: 63.14 kg/m  Temp.: 97.21F  Pulse: 86 (Regular)  BP: 134/86(Sitting, Left Arm, Standard)        Physical Exam Cheryl Hiss M. Shean Gerding MD; 09/04/2020 11:14 AM)  The physical exam findings are as follows: Note:severe obesity; evenly distributed  General Mental Status-Alert. General Appearance-Consistent with stated age. Hydration-Well  hydrated. Voice-Normal.  Head and Neck Head-normocephalic, atraumatic with no lesions or palpable masses. Trachea-midline. Thyroid Gland Characteristics - normal size and consistency.  Eye Eyeball - Bilateral-Extraocular movements intact. Sclera/Conjunctiva - Bilateral-No scleral icterus.  ENMT Ears Pinna - Bilateral - no bony growth in lateral aspect of ear canal, no edema. Nose and Sinuses External Inspection of the Nose - symmetric, no deformities observed. Mouth and Throat -Note:lips intact.   Chest and Lung Exam Chest and lung exam reveals -quiet, even and easy respiratory effort with no use of accessory muscles and on auscultation, normal breath sounds, no adventitious sounds and normal vocal resonance. Inspection Chest Wall - Normal. Back - normal.  Breast - Did not examine.  Cardiovascular Cardiovascular examination reveals -normal heart sounds, regular rate and rhythm with no murmurs and normal pedal pulses bilaterally.  Abdomen Inspection Inspection of the abdomen reveals - No Hernias. Skin - Scar - Note: old hysterectomy scar. Palpation/Percussion Palpation and Percussion of the abdomen reveal - Soft, Non Tender, No Rebound tenderness, No Rigidity (guarding) and No hepatosplenomegaly. Auscultation Auscultation of the abdomen reveals - Bowel sounds normal.  Peripheral Vascular Upper Extremity Palpation - Pulses bilaterally normal.  Neurologic Neurologic evaluation reveals -alert and oriented x 3 with no impairment of recent or remote memory. Mental Status-Normal.  Neuropsychiatric The patient's mood and affect are described as -normal. Judgment and Insight-insight is appropriate concerning matters relevant to self.  Musculoskeletal Normal Exam - Left-Upper Extremity Strength Normal and Lower Extremity Strength Normal. Normal Exam - Right-Upper Extremity Strength Normal and Lower Extremity Strength Normal. Note: bilateral  knee crepitus  Lymphatic Head & Neck  General Head & Neck Lymphatics: Bilateral - Description - Normal. Axillary - Did not examine. Femoral & Inguinal - Did not examine.  Assessment & Plan Cheryl Hiss M. Bernd Crom MD; 09/04/2020 11:14 AM)  SEVERE OBESITY (E66.01) Story: I believe the patient meets weight loss surgery criteria. Impression: The patient meets weight loss surgery criteria. I think the patient is an acceptable candidate for Laparoscopic vertical sleeve gastrectomy.  We rediscussed LSG. We discussed the preoperative, postoperative process. We reviewed her workup. We discussed her labs. We discussed the typical hospitalization. I discussed the importance of the preoperative meal plan. She has her preoperative education classes next week. I congratulated her on her recent physical activity and weight training. All of her questions were asked and answered  We rediscussed that before and after surgery that there would be an alteration in their diet. I explained that we have put them on a diet 2 weeks before surgery. I also explained that they would be on a liquid diet for 2 weeks after surgery. We discussed that they would have to avoid certain foods after surgery. We discussed the importance of physical activity as well as compliance with our dietary and supplement recommendations and routine follow-up.  This patient encounter took 28 minutes today to perform the following: take history, perform exam, review outside records, interpret imaging, counsel the patient on their diagnosis and document encounter, findings & plan in the EHR  Current Plans Pt Education - EMW_preopbariatric  KNEE OSTEOMYELITIS, LEFT (M86.9)   OTHER FATIGUE (R53.83)   PREDIABETES (R73.03)   ESSENTIAL HYPERTENSION (I10)   HYPERCHOLESTEREMIA (E78.00)  Leighton Ruff. Redmond Pulling, MD, FACS General, Bariatric, & Minimally Invasive Surgery [facility], [place]

## 2020-09-09 ENCOUNTER — Other Ambulatory Visit: Payer: Self-pay

## 2020-09-09 ENCOUNTER — Encounter: Payer: BC Managed Care – PPO | Attending: General Surgery | Admitting: Skilled Nursing Facility1

## 2020-09-09 DIAGNOSIS — E669 Obesity, unspecified: Secondary | ICD-10-CM | POA: Diagnosis present

## 2020-09-10 NOTE — Progress Notes (Signed)
Pre-Operative Nutrition Class:  Appt start time: 8069   End time:  1830.  Patient was seen on 09/09/2020 for Pre-Operative Bariatric Surgery Education at the [facility].    Surgery date: 09/23/2020 Surgery type: Sleeve Start weight at [facility]: 380.7 lb Weight today: 384.7 lb  Samples given per MNT protocol. Patient educated on appropriate usage: Multivitamin- procare Lot [id number] Exp:11/2021   Calcium - celebrate Lot #[id number] Exp:02/13/2022   Protein 20  Shake Lot #[id number] Exp: 08/08/2021   The following the learning objectives were met by the patient during this course:  Identify Pre-Op Dietary Goals and will begin 2 weeks pre-operatively  Identify appropriate sources of fluids and proteins   State protein recommendations and appropriate sources pre and post-operatively  Identify Post-Operative Dietary Goals and will follow for 2 weeks post-operatively  Identify appropriate multivitamin and calcium sources  Describe the need for physical activity post-operatively and will follow MD recommendations  State when to call healthcare provider regarding medication questions or post-operative complications  Handouts given during class include:  Pre-Op Bariatric Surgery Diet Handout  Protein Shake Handout  Post-Op Bariatric Surgery Nutrition Handout  [facility] Program Information Flyer  Support Group Information Flyer  [facility] Bariatric Supplements Price List  Follow-Up Plan: Patient will follow-up at [facility] 2 weeks post operatively for diet advancement per MD.

## 2020-09-13 ENCOUNTER — Ambulatory Visit: Payer: BC Managed Care – PPO | Admitting: Allergy

## 2020-09-16 NOTE — Progress Notes (Signed)
DUE TO COVID-19 ONLY ONE VISITOR IS ALLOWED TO COME WITH YOU AND STAY IN THE WAITING ROOM ONLY DURING PRE OP AND PROCEDURE DAY OF SURGERY. THE 1 VISITOR  MAY VISIT WITH YOU AFTER SURGERY IN YOUR PRIVATE ROOM DURING VISITING HOURS ONLY!  YOU NEED TO HAVE A COVID 19 TEST ON__11/18/2021 _____ @_______ , THIS TEST MUST BE DONE BEFORE SURGERY,  COVID TESTING SITE [address] [place] [address], IT IS ON THE RIGHT GOING OUT [address] APPROXIMATELY  2 MINUTES PAST [employer] ON THE RIGHT. ONCE YOUR COVID TEST IS COMPLETED,  PLEASE BEGIN THE QUARANTINE INSTRUCTIONS AS OUTLINED IN YOUR HANDOUT.                Cheryl Morrow  09/16/2020   Your procedure is scheduled on: 09/23/2020    Report to [facility] Main  Entrance   Report to admitting at   0800am AM     Call this number if you have problems the morning of surgery [phone]    REMEMBER: NO  SOLID FOOD CANDY OR GUM AFTER MIDNIGHT. CLEAR LIQUIDS UNTIL     0700 am   . NOTHING BY MOUTH EXCEPT CLEAR LIQUIDS UNTIL    . PLEASE FINISH ENSURE [id number] PER SURGEON ORDER  WHICH NEEDS TO BE COMPLETED AT    0700am  .      CLEAR LIQUID DIET   Foods Allowed                                                                    Coffee and tea, regular and decaf                            Fruit ices (not with fruit pulp)                                      Iced Popsicles                                    Carbonated beverages, regular and diet                                    Cranberry, grape and apple juices Sports drinks like Gatorade Lightly seasoned clear broth or consume(fat free) Sugar, honey syrup ___________________________________________________________________      BRUSH YOUR TEETH MORNING OF SURGERY AND RINSE YOUR MOUTH OUT, NO CHEWING GUM CANDY OR MINTS.     Take these medicines the morning of surgery with A SIP OF WATER:  Amlodipine, nasal spray, pepcid   DO NOT TAKE ANY DIABETIC MEDICATIONS  DAY OF YOUR SURGERY                               You may not have any metal on your body including hair pins and              piercings  Do not  wear jewelry, make-up, lotions, powders or perfumes, deodorant             Do not wear nail polish on your fingernails.  Do not shave  48 hours prior to surgery.              Men may shave face and neck.   Do not bring valuables to the hospital. [place].  Contacts, dentures or bridgework may not be worn into surgery.  Leave suitcase in the car. After surgery it may be brought to your room.     Patients discharged the day of surgery will not be allowed to drive home. IF YOU ARE HAVING SURGERY AND GOING HOME THE SAME DAY, YOU MUST HAVE AN ADULT TO DRIVE YOU HOME AND BE WITH YOU FOR 24 HOURS. YOU MAY GO HOME BY TAXI OR UBER OR ORTHERWISE, BUT AN ADULT MUST ACCOMPANY YOU HOME AND STAY WITH YOU FOR 24 HOURS.  Name and phone number of your driver:  Special Instructions: N/A              Please read over the following fact sheets you were given: _____________________________________________________________________  [facility] - Preparing for Surgery Before surgery, you can play an important role.  Because skin is not sterile, your skin needs to be as free of germs as possible.  You can reduce the number of germs on your skin by washing with CHG (chlorahexidine gluconate) soap before surgery.  CHG is an antiseptic cleaner which kills germs and bonds with the skin to continue killing germs even after washing. Please DO NOT use if you have an allergy to CHG or antibacterial soaps.  If your skin becomes reddened/irritated stop using the CHG and inform your nurse when you arrive at Short Stay. Do not shave (including legs and underarms) for at least 48 hours prior to the first CHG shower.  You may shave your face/neck. Please follow these instructions carefully:  1.  Shower with CHG Soap the night before  surgery and the  morning of Surgery.  2.  If you choose to wash your hair, wash your hair first as usual with your  normal  shampoo.  3.  After you shampoo, rinse your hair and body thoroughly to remove the  shampoo.                           4.  Use CHG as you would any other liquid soap.  You can apply chg directly  to the skin and wash                       Gently with a scrungie or clean washcloth.  5.  Apply the CHG Soap to your body ONLY FROM THE NECK DOWN.   Do not use on face/ open                           Wound or open sores. Avoid contact with eyes, ears mouth and genitals (private parts).                       Wash face,  Genitals (private parts) with your normal soap.             6.  Wash thoroughly, paying  special attention to the area where your surgery  will be performed.  7.  Thoroughly rinse your body with warm water from the neck down.  8.  DO NOT shower/wash with your normal soap after using and rinsing off  the CHG Soap.                9.  Pat yourself dry with a clean towel.            10.  Wear clean pajamas.            11.  Place clean sheets on your bed the night of your first shower and do not  sleep with pets. Day of Surgery : Do not apply any lotions/deodorants the morning of surgery.  Please wear clean clothes to the hospital/surgery center.  FAILURE TO FOLLOW THESE INSTRUCTIONS MAY RESULT IN THE CANCELLATION OF YOUR SURGERY PATIENT SIGNATURE_________________________________  NURSE SIGNATURE__________________________________  ________________________________________________________________________

## 2020-09-19 ENCOUNTER — Other Ambulatory Visit: Payer: Self-pay

## 2020-09-19 ENCOUNTER — Encounter (HOSPITAL_COMMUNITY)
Admission: RE | Admit: 2020-09-19 | Discharge: 2020-09-19 | Disposition: A | Discharge status: Home / Self Care | Payer: BC Managed Care – PPO | Source: Ambulatory Visit | Attending: General Surgery | Admitting: General Surgery

## 2020-09-19 ENCOUNTER — Encounter (HOSPITAL_COMMUNITY): Payer: Self-pay

## 2020-09-19 ENCOUNTER — Other Ambulatory Visit (HOSPITAL_COMMUNITY)
Admission: RE | Admit: 2020-09-19 | Discharge: 2020-09-19 | Disposition: A | Discharge status: Home / Self Care | Payer: BC Managed Care – PPO | Source: Ambulatory Visit | Attending: General Surgery | Admitting: General Surgery

## 2020-09-19 DIAGNOSIS — Z20822 Contact with and (suspected) exposure to covid-19: Secondary | ICD-10-CM | POA: Diagnosis not present

## 2020-09-19 DIAGNOSIS — Z01812 Encounter for preprocedural laboratory examination: Secondary | ICD-10-CM | POA: Diagnosis present

## 2020-09-19 LAB — COMPREHENSIVE METABOLIC PANEL
ALT: 36 U/L (ref 0–44)
AST: 27 U/L (ref 15–41)
Albumin: 3.9 g/dL (ref 3.5–5.0)
Alkaline Phosphatase: 80 U/L (ref 38–126)
Anion gap: 12 (ref 5–15)
BUN: 16 mg/dL (ref 6–20)
CO2: 27 mmol/L (ref 22–32)
Calcium: 8.8 mg/dL — ABNORMAL LOW (ref 8.9–10.3)
Chloride: 98 mmol/L (ref 98–111)
Creatinine, Ser: 1.15 mg/dL — ABNORMAL HIGH (ref 0.44–1.00)
GFR, Estimated: 59 mL/min — ABNORMAL LOW (ref >60)
Glucose, Bld: 99 mg/dL (ref 70–99)
Potassium: 3 mmol/L — ABNORMAL LOW (ref 3.5–5.1)
Sodium: 137 mmol/L (ref 135–145)
Total Bilirubin: 0.7 mg/dL (ref 0.3–1.2)
Total Protein: 8.1 g/dL (ref 6.5–8.1)

## 2020-09-19 LAB — CBC WITH DIFFERENTIAL/PLATELET
Abs Immature Granulocytes: 0.01 10*3/uL (ref 0.00–0.07)
Basophils Absolute: 0 10*3/uL (ref 0.0–0.1)
Basophils Relative: 1 %
Eosinophils Absolute: 0.1 10*3/uL (ref 0.0–0.5)
Eosinophils Relative: 1 %
HCT: 39.9 % (ref 36.0–46.0)
Hemoglobin: 12.6 g/dL (ref 12.0–15.0)
Immature Granulocytes: 0 %
Lymphocytes Relative: 38 %
Lymphs Abs: 2.2 10*3/uL (ref 0.7–4.0)
MCH: 28.1 pg (ref 26.0–34.0)
MCHC: 31.6 g/dL (ref 30.0–36.0)
MCV: 88.9 fL (ref 80.0–100.0)
Monocytes Absolute: 0.5 10*3/uL (ref 0.1–1.0)
Monocytes Relative: 8 %
Neutro Abs: 2.9 10*3/uL (ref 1.7–7.7)
Neutrophils Relative %: 52 %
Platelets: 234 10*3/uL (ref 150–400)
RBC: 4.49 MIL/uL (ref 3.87–5.11)
RDW: 13.9 % (ref 11.5–15.5)
WBC: 5.7 10*3/uL (ref 4.0–10.5)
nRBC: 0 % (ref 0.0–0.2)

## 2020-09-19 NOTE — Anesthesia Preprocedure Evaluation (Addendum)
Anesthesia Evaluation  Patient identified by MRN, date of birth, ID band Patient awake    Reviewed: Allergy & Precautions, NPO status , Patient's Chart, lab work & pertinent test results  Airway Mallampati: II  TM Distance: >3 FB Neck ROM: Full    Dental no notable dental hx. (+) Teeth Intact   Pulmonary neg pulmonary ROS,    Pulmonary exam normal breath sounds clear to auscultation       Cardiovascular hypertension, Pt. on medications Normal cardiovascular exam+ Valvular Problems/Murmurs  Rhythm:Regular Rate:Normal  EKG 04/12/20 NSR, poor R wave progession in V leads  Echo 10/04/19 1. Left ventricular ejection fraction, by visual estimation, is 50 to 55%. The left ventricle has low normal function. There is no left ventricular hypertrophy.  2. Inferior basal hypokinesis Abnormal GLS -13.1.  3. Global right ventricle has normal systolic function.The right ventricular size is normal. No increase in right ventricular wall thickness.  4. Left atrial size was mildly dilated.  5. Right atrial size was normal.  6. The mitral valve is normal in structure. Trace mitral valve  regurgitation. No evidence of mitral stenosis.  7. The tricuspid valve is normal in structure. Tricuspid valve regurgitation is not demonstrated.  8. The aortic valve is tricuspid. Aortic valve regurgitation is not visualized. Mild aortic valve sclerosis without stenosis.  9. The pulmonic valve was normal in structure. Pulmonic valve regurgitation is not visualized.  10. Normal pulmonary artery systolic pressure.  11. The inferior vena cava is normal in size with greater than 50% respiratory variability, suggesting right atrial pressure of 3 mmHg.    Neuro/Psych negative neurological ROS  negative psych ROS   GI/Hepatic negative GI ROS, Neg liver ROS,   Endo/Other  Morbid obesityHyperlipidemia  Renal/GU negative Renal ROS  negative genitourinary    Musculoskeletal  (+) Arthritis , Osteoarthritis,    Abdominal (+) + obese,   Peds  Hematology  (+) anemia ,   Anesthesia Other Findings   Reproductive/Obstetrics                            Anesthesia Physical Anesthesia Plan  ASA: III  Anesthesia Plan: General   Post-op Pain Management:    Induction: Cricoid pressure planned, Rapid sequence and Intravenous  PONV Risk Score and Plan: 4 or greater and Scopolamine patch - Pre-op, Ondansetron, Treatment may vary due to age or medical condition and Midazolam  Airway Management Planned: Oral ETT  Additional Equipment:   Intra-op Plan:   Post-operative Plan: Extubation in OR  Informed Consent: I have reviewed the patients History and Physical, chart, labs and discussed the procedure including the risks, benefits and alternatives for the proposed anesthesia with the patient or authorized representative who has indicated his/her understanding and acceptance.     Dental advisory given  Plan Discussed with: CRNA and Anesthesiologist  Anesthesia Plan Comments: (Per cardiology preoperative risk assessment 06/27/2020, "Chart reviewed as part of pre-operative protocol coverage. Patient was contacted 06/27/2020 in reference to pre-operative risk assessment for pending surgery as outlined below.  Cheryl Morrow was last seen on 04/19/20 by Agustin Cree.  Since that day, Cheryl Morrow has done well. Dr. Agustin Cree cleared her for surgery at that appt. Since that time, she denies any new anginal symptoms, no dyspnea. She can complete more than 4.0 METS (walks 1-2 miles, climbs stairs)."  Therefore, based on ACC/AHA guidelines, the patient would be at acceptable risk for the planned procedure without further cardiovascular testing. )  Anesthesia Quick Evaluation  

## 2020-09-19 NOTE — Progress Notes (Addendum)
Anesthesia Review:  PCP:  Cardiologist  Cheryl Bunnell- Telephone note clearance- dated 06/26/2020  Chest x-ray :04/12/20  EKG :04/12/2020  Echo :2020 Stress test: 2020  Cardiac Cath :  Activity level: can do a flight of stairs without difficulty  Sleep Study/ CPAP : no  Fasting Blood Sugar :      / Checks Blood Sugar -- times a day:   Blood Thinner/ Instructions /Last Dose: ASA / Instructions/ Last Dose :  Blood pressure elevated at preop on 09/19/20- pt took blood presssure medication at preop appt .  cMP done 09/19/2020 routed to DR Redmond Pulling.

## 2020-09-20 LAB — SARS CORONAVIRUS 2 (TAT 6-24 HRS): SARS Coronavirus 2: NEGATIVE

## 2020-09-22 MED ORDER — BUPIVACAINE LIPOSOME 1.3 % IJ SUSP
20.0000 mL | Freq: Once | INTRAMUSCULAR | Status: DC
  Filled 2020-09-22: qty 20

## 2020-09-23 ENCOUNTER — Ambulatory Visit (HOSPITAL_COMMUNITY): Payer: BC Managed Care – PPO | Admitting: Physician Assistant

## 2020-09-23 ENCOUNTER — Observation Stay (HOSPITAL_COMMUNITY)
Admission: RE | Admit: 2020-09-23 | Discharge: 2020-09-24 | Disposition: A | Discharge status: Home / Self Care | Payer: BC Managed Care – PPO | Attending: General Surgery | Admitting: General Surgery

## 2020-09-23 ENCOUNTER — Encounter (HOSPITAL_COMMUNITY): Payer: Self-pay | Admitting: General Surgery

## 2020-09-23 ENCOUNTER — Other Ambulatory Visit: Payer: Self-pay

## 2020-09-23 ENCOUNTER — Ambulatory Visit (HOSPITAL_COMMUNITY): Payer: BC Managed Care – PPO | Admitting: Certified Registered Nurse Anesthetist

## 2020-09-23 ENCOUNTER — Encounter (HOSPITAL_COMMUNITY)
Admission: RE | Disposition: A | Discharge status: Home / Self Care | Payer: Self-pay | Source: Home / Self Care | Attending: General Surgery

## 2020-09-23 DIAGNOSIS — M1710 Unilateral primary osteoarthritis, unspecified knee: Secondary | ICD-10-CM | POA: Diagnosis present

## 2020-09-23 DIAGNOSIS — I10 Essential (primary) hypertension: Secondary | ICD-10-CM | POA: Diagnosis not present

## 2020-09-23 DIAGNOSIS — R7303 Prediabetes: Secondary | ICD-10-CM | POA: Insufficient documentation

## 2020-09-23 DIAGNOSIS — Z6841 Body Mass Index (BMI) 40.0 and over, adult: Secondary | ICD-10-CM | POA: Diagnosis not present

## 2020-09-23 DIAGNOSIS — Z9884 Bariatric surgery status: Secondary | ICD-10-CM

## 2020-09-23 DIAGNOSIS — E7800 Pure hypercholesterolemia, unspecified: Secondary | ICD-10-CM | POA: Diagnosis present

## 2020-09-23 DIAGNOSIS — I517 Cardiomegaly: Secondary | ICD-10-CM | POA: Insufficient documentation

## 2020-09-23 HISTORY — PX: LAPAROSCOPIC GASTRIC SLEEVE RESECTION: SHX5895

## 2020-09-23 HISTORY — DX: Bariatric surgery status: Z98.84

## 2020-09-23 HISTORY — PX: UPPER GI ENDOSCOPY: SHX6162

## 2020-09-23 LAB — TYPE AND SCREEN
ABO/RH(D): O POS
Antibody Screen: POSITIVE
PT AG Type: NEGATIVE
Unit division: 0
Unit division: 0

## 2020-09-23 LAB — HEMOGLOBIN A1C
Hgb A1c MFr Bld: 5.8 % — ABNORMAL HIGH (ref 4.8–5.6)
Mean Plasma Glucose: 119.76 mg/dL

## 2020-09-23 LAB — BPAM RBC
Blood Product Expiration Date: 202112182359
Blood Product Expiration Date: 202112192359
Unit Type and Rh: 5100
Unit Type and Rh: 5100

## 2020-09-23 LAB — ABO/RH: ABO/RH(D): O POS

## 2020-09-23 LAB — GLUCOSE, CAPILLARY
Glucose-Capillary: 119 mg/dL — ABNORMAL HIGH (ref 70–99)
Glucose-Capillary: 120 mg/dL — ABNORMAL HIGH (ref 70–99)
Glucose-Capillary: 156 mg/dL — ABNORMAL HIGH (ref 70–99)

## 2020-09-23 LAB — HEMOGLOBIN AND HEMATOCRIT, BLOOD
HCT: 40.6 % (ref 36.0–46.0)
Hemoglobin: 12.4 g/dL (ref 12.0–15.0)

## 2020-09-23 LAB — SURGICAL PATHOLOGY

## 2020-09-23 SURGERY — XI ROBOTIC GASTRIC SLEEVE RESECTION
Anesthesia: General | Site: Abdomen

## 2020-09-23 MED ORDER — GABAPENTIN 100 MG PO CAPS
200.0000 mg | ORAL_CAPSULE | Freq: Two times a day (BID) | ORAL | Status: DC
  Administered 2020-09-23 – 2020-09-24 (×3): 200 mg via ORAL
  Filled 2020-09-23 (×4): qty 2

## 2020-09-23 MED ORDER — FENTANYL CITRATE (PF) 250 MCG/5ML IJ SOLN
INTRAMUSCULAR | Status: AC
  Filled 2020-09-23: qty 5

## 2020-09-23 MED ORDER — ENSURE MAX PROTEIN PO LIQD
2.0000 [oz_av] | ORAL | Status: DC
  Administered 2020-09-24 (×2): 2 [oz_av] via ORAL

## 2020-09-23 MED ORDER — LACTATED RINGERS IV SOLN: INTRAVENOUS | Status: DC

## 2020-09-23 MED ORDER — HYDRALAZINE HCL 20 MG/ML IJ SOLN
20.0000 mg | INTRAMUSCULAR | Status: DC | PRN
  Administered 2020-09-23: 17:00:00 20 mg via INTRAVENOUS
  Filled 2020-09-23: qty 1

## 2020-09-23 MED ORDER — LOSARTAN POTASSIUM-HCTZ 50-12.5 MG PO TABS: 1.0000 | ORAL_TABLET | Freq: Two times a day (BID) | ORAL | Status: DC

## 2020-09-23 MED ORDER — DIPHENHYDRAMINE HCL 50 MG/ML IJ SOLN: 12.5000 mg | Freq: Three times a day (TID) | INTRAMUSCULAR | Status: DC | PRN

## 2020-09-23 MED ORDER — DEXAMETHASONE SODIUM PHOSPHATE 10 MG/ML IJ SOLN
INTRAMUSCULAR | Status: AC
  Filled 2020-09-23: qty 1

## 2020-09-23 MED ORDER — ONDANSETRON HCL 4 MG/2ML IJ SOLN
INTRAMUSCULAR | Status: AC
  Filled 2020-09-23: qty 2

## 2020-09-23 MED ORDER — HYDROCHLOROTHIAZIDE 12.5 MG PO CAPS
12.5000 mg | ORAL_CAPSULE | Freq: Two times a day (BID) | ORAL | Status: DC
  Administered 2020-09-24: 11:00:00 12.5 mg via ORAL
  Filled 2020-09-23: qty 1

## 2020-09-23 MED ORDER — SCOPOLAMINE 1 MG/3DAYS TD PT72: 1.0000 | MEDICATED_PATCH | Freq: Once | TRANSDERMAL | Status: DC

## 2020-09-23 MED ORDER — ROCURONIUM BROMIDE 10 MG/ML (PF) SYRINGE
PREFILLED_SYRINGE | INTRAVENOUS | Status: AC
  Filled 2020-09-23: qty 10

## 2020-09-23 MED ORDER — POTASSIUM CHLORIDE IN NACL 20-0.9 MEQ/L-% IV SOLN
INTRAVENOUS | Status: DC
  Filled 2020-09-23 (×3): qty 1000

## 2020-09-23 MED ORDER — PHENYLEPHRINE HCL-NACL 10-0.9 MG/250ML-% IV SOLN
INTRAVENOUS | Status: DC | PRN
  Administered 2020-09-23: 50 ug/min via INTRAVENOUS

## 2020-09-23 MED ORDER — SODIUM CHLORIDE (PF) 0.9 % IJ SOLN
INTRAMUSCULAR | Status: DC | PRN
  Administered 2020-09-23: 50 mL

## 2020-09-23 MED ORDER — ACETAMINOPHEN 500 MG PO TABS
1000.0000 mg | ORAL_TABLET | ORAL | Status: AC
  Administered 2020-09-23: 1000 mg via ORAL
  Filled 2020-09-23: qty 2

## 2020-09-23 MED ORDER — BUPIVACAINE LIPOSOME 1.3 % IJ SUSP
INTRAMUSCULAR | Status: DC | PRN
  Administered 2020-09-23: 20 mL

## 2020-09-23 MED ORDER — FENTANYL CITRATE (PF) 250 MCG/5ML IJ SOLN
INTRAMUSCULAR | Status: DC | PRN
Start: 2020-09-23 — End: 2020-09-23
  Administered 2020-09-23 (×2): 50 ug via INTRAVENOUS
  Administered 2020-09-23: 100 ug via INTRAVENOUS
  Administered 2020-09-23: 50 ug via INTRAVENOUS

## 2020-09-23 MED ORDER — ENOXAPARIN SODIUM 30 MG/0.3ML ~~LOC~~ SOLN
30.0000 mg | Freq: Two times a day (BID) | SUBCUTANEOUS | Status: DC
  Administered 2020-09-23 – 2020-09-24 (×2): 30 mg via SUBCUTANEOUS
  Filled 2020-09-23 (×2): qty 0.3

## 2020-09-23 MED ORDER — ACETAMINOPHEN 500 MG PO TABS
1000.0000 mg | ORAL_TABLET | Freq: Three times a day (TID) | ORAL | Status: DC
  Administered 2020-09-23 – 2020-09-24 (×2): 1000 mg via ORAL
  Filled 2020-09-23 (×2): qty 2

## 2020-09-23 MED ORDER — DEXAMETHASONE SODIUM PHOSPHATE 10 MG/ML IJ SOLN
INTRAMUSCULAR | Status: DC | PRN
  Administered 2020-09-23: 6 mg via INTRAVENOUS

## 2020-09-23 MED ORDER — SIMETHICONE 80 MG PO CHEW: 80.0000 mg | CHEWABLE_TABLET | Freq: Four times a day (QID) | ORAL | Status: DC | PRN

## 2020-09-23 MED ORDER — PROPOFOL 10 MG/ML IV BOLUS
INTRAVENOUS | Status: AC
  Filled 2020-09-23: qty 20

## 2020-09-23 MED ORDER — SUCCINYLCHOLINE CHLORIDE 200 MG/10ML IV SOSY
PREFILLED_SYRINGE | INTRAVENOUS | Status: AC
  Filled 2020-09-23: qty 20

## 2020-09-23 MED ORDER — SUCCINYLCHOLINE CHLORIDE 200 MG/10ML IV SOSY
PREFILLED_SYRINGE | INTRAVENOUS | Status: DC | PRN
  Administered 2020-09-23: 160 mg via INTRAVENOUS

## 2020-09-23 MED ORDER — SODIUM CHLORIDE 0.9 % IV SOLN
2.0000 g | INTRAVENOUS | Status: AC
  Administered 2020-09-23: 2 g via INTRAVENOUS
  Filled 2020-09-23: qty 2

## 2020-09-23 MED ORDER — PANTOPRAZOLE SODIUM 40 MG IV SOLR
40.0000 mg | Freq: Every day | INTRAVENOUS | Status: DC
  Administered 2020-09-23: 22:00:00 40 mg via INTRAVENOUS
  Filled 2020-09-23: qty 40

## 2020-09-23 MED ORDER — ACETAMINOPHEN 160 MG/5ML PO SOLN: 1000.0000 mg | Freq: Three times a day (TID) | ORAL | Status: DC

## 2020-09-23 MED ORDER — LIDOCAINE 2% (20 MG/ML) 5 ML SYRINGE
INTRAMUSCULAR | Status: AC
  Filled 2020-09-23: qty 5

## 2020-09-23 MED ORDER — INSULIN ASPART 100 UNIT/ML ~~LOC~~ SOLN
0.0000 [IU] | SUBCUTANEOUS | Status: DC
  Administered 2020-09-23: 21:00:00 4 [IU] via SUBCUTANEOUS

## 2020-09-23 MED ORDER — 0.9 % SODIUM CHLORIDE (POUR BTL) OPTIME
TOPICAL | Status: DC | PRN
  Administered 2020-09-23: 1000 mL

## 2020-09-23 MED ORDER — KETAMINE HCL 10 MG/ML IJ SOLN
INTRAMUSCULAR | Status: DC | PRN
  Administered 2020-09-23: 30 mg via INTRAVENOUS

## 2020-09-23 MED ORDER — SUGAMMADEX SODIUM 500 MG/5ML IV SOLN
INTRAVENOUS | Status: DC | PRN
  Administered 2020-09-23: 500 mg via INTRAVENOUS

## 2020-09-23 MED ORDER — ONDANSETRON HCL 4 MG/2ML IJ SOLN: 4.0000 mg | Freq: Four times a day (QID) | INTRAMUSCULAR | Status: DC | PRN

## 2020-09-23 MED ORDER — CHLORHEXIDINE GLUCONATE 4 % EX LIQD: 60.0000 mL | Freq: Once | CUTANEOUS | Status: DC

## 2020-09-23 MED ORDER — MIDAZOLAM HCL 5 MG/5ML IJ SOLN
INTRAMUSCULAR | Status: DC | PRN
  Administered 2020-09-23: 2 mg via INTRAVENOUS

## 2020-09-23 MED ORDER — APREPITANT 40 MG PO CAPS
40.0000 mg | ORAL_CAPSULE | ORAL | Status: AC
  Administered 2020-09-23: 40 mg via ORAL
  Filled 2020-09-23: qty 1

## 2020-09-23 MED ORDER — LACTATED RINGERS IR SOLN
Status: DC | PRN
  Administered 2020-09-23: 1000 mL

## 2020-09-23 MED ORDER — DEXAMETHASONE SODIUM PHOSPHATE 4 MG/ML IJ SOLN: 4.0000 mg | INTRAMUSCULAR | Status: DC

## 2020-09-23 MED ORDER — LOSARTAN POTASSIUM 50 MG PO TABS
50.0000 mg | ORAL_TABLET | Freq: Two times a day (BID) | ORAL | Status: DC
  Administered 2020-09-24: 50 mg via ORAL
  Filled 2020-09-23: qty 1

## 2020-09-23 MED ORDER — SODIUM CHLORIDE (PF) 0.9 % IJ SOLN
INTRAMUSCULAR | Status: AC
  Filled 2020-09-23: qty 50

## 2020-09-23 MED ORDER — FENTANYL CITRATE (PF) 100 MCG/2ML IJ SOLN
INTRAMUSCULAR | Status: AC
  Filled 2020-09-23: qty 2

## 2020-09-23 MED ORDER — ROCURONIUM BROMIDE 10 MG/ML (PF) SYRINGE
PREFILLED_SYRINGE | INTRAVENOUS | Status: DC | PRN
  Administered 2020-09-23 (×2): 10 mg via INTRAVENOUS
  Administered 2020-09-23: 70 mg via INTRAVENOUS

## 2020-09-23 MED ORDER — GABAPENTIN 300 MG PO CAPS
300.0000 mg | ORAL_CAPSULE | ORAL | Status: AC
  Administered 2020-09-23: 300 mg via ORAL
  Filled 2020-09-23: qty 1

## 2020-09-23 MED ORDER — ONDANSETRON HCL 4 MG/2ML IJ SOLN
INTRAMUSCULAR | Status: DC | PRN
  Administered 2020-09-23: 4 mg via INTRAVENOUS

## 2020-09-23 MED ORDER — PHENYLEPHRINE 40 MCG/ML (10ML) SYRINGE FOR IV PUSH (FOR BLOOD PRESSURE SUPPORT)
PREFILLED_SYRINGE | INTRAVENOUS | Status: DC | PRN
  Administered 2020-09-23 (×4): 80 ug via INTRAVENOUS

## 2020-09-23 MED ORDER — OXYCODONE HCL 5 MG/5ML PO SOLN
5.0000 mg | Freq: Four times a day (QID) | ORAL | Status: DC | PRN
  Administered 2020-09-23 (×2): 5 mg via ORAL
  Filled 2020-09-23 (×2): qty 5

## 2020-09-23 MED ORDER — PROPOFOL 10 MG/ML IV BOLUS
INTRAVENOUS | Status: DC | PRN
  Administered 2020-09-23: 200 mg via INTRAVENOUS

## 2020-09-23 MED ORDER — MORPHINE SULFATE (PF) 4 MG/ML IV SOLN
1.0000 mg | INTRAVENOUS | Status: DC | PRN
  Administered 2020-09-23: 16:00:00 2 mg via INTRAVENOUS
  Filled 2020-09-23: qty 1

## 2020-09-23 MED ORDER — SCOPOLAMINE 1 MG/3DAYS TD PT72
1.0000 | MEDICATED_PATCH | TRANSDERMAL | Status: DC
  Administered 2020-09-23: 1.5 mg via TRANSDERMAL
  Filled 2020-09-23: qty 1

## 2020-09-23 MED ORDER — LIDOCAINE 2% (20 MG/ML) 5 ML SYRINGE
INTRAMUSCULAR | Status: DC | PRN
  Administered 2020-09-23: 100 mg via INTRAVENOUS

## 2020-09-23 MED ORDER — HEPARIN SODIUM (PORCINE) 5000 UNIT/ML IJ SOLN
5000.0000 [IU] | INTRAMUSCULAR | Status: AC
  Administered 2020-09-23: 5000 [IU] via SUBCUTANEOUS
  Filled 2020-09-23: qty 1

## 2020-09-23 MED ORDER — FENTANYL CITRATE (PF) 100 MCG/2ML IJ SOLN
25.0000 ug | INTRAMUSCULAR | Status: DC | PRN
  Administered 2020-09-23: 50 ug via INTRAVENOUS

## 2020-09-23 MED ORDER — ONDANSETRON HCL 4 MG/2ML IJ SOLN
4.0000 mg | Freq: Once | INTRAMUSCULAR | Status: AC | PRN
  Administered 2020-09-23: 4 mg via INTRAVENOUS

## 2020-09-23 MED ORDER — PROMETHAZINE HCL 25 MG/ML IJ SOLN: 12.5000 mg | Freq: Four times a day (QID) | INTRAMUSCULAR | Status: DC | PRN

## 2020-09-23 MED ORDER — MIDAZOLAM HCL 2 MG/2ML IJ SOLN
INTRAMUSCULAR | Status: AC
  Filled 2020-09-23: qty 2

## 2020-09-23 MED ORDER — LIDOCAINE 2% (20 MG/ML) 5 ML SYRINGE
INTRAMUSCULAR | Status: DC | PRN
  Administered 2020-09-23: 1.5 mg/kg/h via INTRAVENOUS

## 2020-09-23 MED ORDER — SUGAMMADEX SODIUM 500 MG/5ML IV SOLN: INTRAVENOUS | Status: DC | PRN

## 2020-09-23 SURGICAL SUPPLY — 69 items
APPLIER CLIP 5 13 M/L LIGAMAX5 (MISCELLANEOUS) ×4
APPLIER CLIP ROT 10 11.4 M/L (STAPLE)
BLADE SURG SZ11 CARB STEEL (BLADE) ×4 IMPLANT
BNDG ADH 1X3 SHEER STRL LF (GAUZE/BANDAGES/DRESSINGS) ×4 IMPLANT
CANNULA REDUC XI 12-8 STAPL (CANNULA) ×1
CANNULA REDUC XI 12-8MM STAPL (CANNULA) ×1
CANNULA REDUCER 12-8 DVNC XI (CANNULA) ×2 IMPLANT
CHLORAPREP W/TINT 26 (MISCELLANEOUS) ×4 IMPLANT
CLIP APPLIE 5 13 M/L LIGAMAX5 (MISCELLANEOUS) ×2 IMPLANT
CLIP APPLIE ROT 10 11.4 M/L (STAPLE) IMPLANT
CLOSURE WOUND 1/2 X4 (GAUZE/BANDAGES/DRESSINGS) ×1
COVER MAYO STAND STRL (DRAPES) ×4 IMPLANT
COVER SURGICAL LIGHT HANDLE (MISCELLANEOUS) ×4 IMPLANT
COVER TIP SHEARS 8 DVNC (MISCELLANEOUS) IMPLANT
COVER TIP SHEARS 8MM DA VINCI (MISCELLANEOUS)
COVER WAND RF STERILE (DRAPES) ×4 IMPLANT
DECANTER SPIKE VIAL GLASS SM (MISCELLANEOUS) ×4 IMPLANT
DERMABOND ADVANCED (GAUZE/BANDAGES/DRESSINGS) ×2
DERMABOND ADVANCED .7 DNX12 (GAUZE/BANDAGES/DRESSINGS) ×2 IMPLANT
DRAPE 3/4 80X56 (DRAPES) ×4 IMPLANT
DRAPE ARM DVNC X/XI (DISPOSABLE) ×6 IMPLANT
DRAPE COLUMN DVNC XI (DISPOSABLE) ×2 IMPLANT
DRAPE DA VINCI XI ARM (DISPOSABLE) ×6
DRAPE DA VINCI XI COLUMN (DISPOSABLE) ×2
ELECT REM PT RETURN 15FT ADLT (MISCELLANEOUS) ×4 IMPLANT
ENDOLOOP SUT PDS II  0 18 (SUTURE)
ENDOLOOP SUT PDS II 0 18 (SUTURE) IMPLANT
GAUZE 4X4 16PLY RFD (DISPOSABLE) ×4 IMPLANT
GLOVE BIO SURGEON STRL SZ7.5 (GLOVE) ×8 IMPLANT
GLOVE INDICATOR 8.0 STRL GRN (GLOVE) ×8 IMPLANT
GOWN STRL REUS W/TWL LRG LVL3 (GOWN DISPOSABLE) ×12 IMPLANT
GOWN STRL REUS W/TWL XL LVL3 (GOWN DISPOSABLE) ×8 IMPLANT
MARKER SKIN DUAL TIP RULER LAB (MISCELLANEOUS) ×4 IMPLANT
NEEDLE HYPO 22GX1.5 SAFETY (NEEDLE) IMPLANT
NEEDLE INSUFFLATION 14GA 120MM (NEEDLE) IMPLANT
NEEDLE SPNL 22GX3.5 QUINCKE BK (NEEDLE) ×4 IMPLANT
PACK CARDIOVASCULAR III (CUSTOM PROCEDURE TRAY) ×4 IMPLANT
PAD POSITIONING PINK XL (MISCELLANEOUS) IMPLANT
RELOAD STAPLER 3.5X60 BLU DVNC (STAPLE) ×10 IMPLANT
RELOAD STAPLER 4.3X60 GRN DVNC (STAPLE) ×2 IMPLANT
SCISSORS LAP 5X35 DISP (ENDOMECHANICALS) IMPLANT
SEAL CANN UNIV 5-8 DVNC XI (MISCELLANEOUS) ×4 IMPLANT
SEAL XI 5MM-8MM UNIVERSAL (MISCELLANEOUS) ×4
SEALER VESSEL DA VINCI XI (MISCELLANEOUS) ×2
SEALER VESSEL EXT DVNC XI (MISCELLANEOUS) ×2 IMPLANT
SET IRRIG TUBING LAPAROSCOPIC (IRRIGATION / IRRIGATOR) ×4 IMPLANT
SLEEVE GASTRECTOMY 40FR VISIGI (MISCELLANEOUS) ×4 IMPLANT
SOL ANTI FOG 6CC (MISCELLANEOUS) ×2 IMPLANT
SOLUTION ANTI FOG 6CC (MISCELLANEOUS) ×2
SOLUTION ELECTROLUBE (MISCELLANEOUS) ×4 IMPLANT
STAPLER 60 DA VINCI SURE FORM (STAPLE) ×2
STAPLER 60 SUREFORM DVNC (STAPLE) ×2 IMPLANT
STAPLER CANNULA SEAL DVNC XI (STAPLE) ×2 IMPLANT
STAPLER CANNULA SEAL XI (STAPLE) ×2
STAPLER RELOAD 3.5X60 BLU DVNC (STAPLE) ×10
STAPLER RELOAD 3.5X60 BLUE (STAPLE) ×10
STAPLER RELOAD 4.3X60 GREEN (STAPLE) ×2
STAPLER RELOAD 4.3X60 GRN DVNC (STAPLE) ×2
STRIP CLOSURE SKIN 1/2X4 (GAUZE/BANDAGES/DRESSINGS) ×3 IMPLANT
SUT MNCRL AB 4-0 PS2 18 (SUTURE) ×4 IMPLANT
SUT VLOC 180 2-0 9IN GS21 (SUTURE) IMPLANT
SYR 20ML LL LF (SYRINGE) ×4 IMPLANT
TAPE STRIPS DRAPE STRL (GAUZE/BANDAGES/DRESSINGS) ×4 IMPLANT
TOWEL OR 17X26 10 PK STRL BLUE (TOWEL DISPOSABLE) ×4 IMPLANT
TOWEL OR NON WOVEN STRL DISP B (DISPOSABLE) ×4 IMPLANT
TRAY FOLEY MTR SLVR 16FR STAT (SET/KITS/TRAYS/PACK) IMPLANT
TROCAR ADV FIXATION 5X100MM (TROCAR) IMPLANT
TROCAR BLADELESS OPT 5 100 (ENDOMECHANICALS) ×4 IMPLANT
TUBING INSUFFLATION 10FT LAP (TUBING) ×4 IMPLANT

## 2020-09-23 NOTE — Anesthesia Postprocedure Evaluation (Signed)
Anesthesia Post Note  Patient: Cheryl Morrow  Procedure(s) Performed: XI ROBOTIC GASTRIC SLEEVE RESECTION (N/A Abdomen) UPPER GI ENDOSCOPY (N/A )     Patient location during evaluation: PACU Anesthesia Type: General Level of consciousness: awake and alert and oriented Pain management: pain level controlled Vital Signs Assessment: post-procedure vital signs reviewed and stable Respiratory status: spontaneous breathing, nonlabored ventilation and respiratory function stable Cardiovascular status: blood pressure returned to baseline and stable Postop Assessment: no apparent nausea or vomiting Anesthetic complications: no   No complications documented.  Last Vitals:  Vitals:   09/23/20 1230 09/23/20 1300  BP:  (!) 154/95  Pulse: 75 75  Resp: 20   Temp:    SpO2: 98% 98%    Last Pain:  Vitals:   09/23/20 1315  TempSrc:   PainSc: Asleep                 Akshita Italiano A.

## 2020-09-23 NOTE — Interval H&P Note (Signed)
History and Physical Interval Note:  09/23/2020 9:33 AM  Cheryl Morrow  has presented today for surgery, with the diagnosis of MORBID OBESITY.  The various methods of treatment have been discussed with the patient and family. After consideration of risks, benefits and other options for treatment, the patient has consented to  Procedure(s): XI ROBOTIC GASTRIC SLEEVE RESECTION (N/A) UPPER GI ENDOSCOPY (N/A) as a surgical intervention.  The patient's history has been reviewed, patient examined, no change in status, stable for surgery.  I have reviewed the patient's chart and labs.  Questions were answered to the patient's satisfaction.    Discussed using surgical robot for case  Leighton Ruff. Redmond Pulling, MD, FACS General, Bariatric, & Minimally Invasive Surgery [facility] [place]  Greer Pickerel

## 2020-09-23 NOTE — Progress Notes (Signed)
Discussed post op day goals with patient including ambulation, IS, diet progression, pain, and nausea control.  BSTOP education provided including BSTOP information guide, "Guide for Pain Management after your Bariatric Procedure".  Questions answered. 

## 2020-09-23 NOTE — Anesthesia Procedure Notes (Addendum)
Procedure Name: Intubation Date/Time: 09/23/2020 10:16 AM Performed by: West Pugh, CRNA Pre-anesthesia Checklist: Patient identified, Emergency Drugs available, Suction available, Patient being monitored and Timeout performed Patient Re-evaluated:Patient Re-evaluated prior to induction Oxygen Delivery Method: Circle system utilized Preoxygenation: Pre-oxygenation with 100% oxygen Induction Type: IV induction, Rapid sequence and Cricoid Pressure applied Laryngoscope Size: Mac and 4 Grade View: Grade I Tube type: Oral Tube size: 7.5 mm Number of attempts: 1 Airway Equipment and Method: Stylet Placement Confirmation: ETT inserted through vocal cords under direct vision,  positive ETCO2,  CO2 detector and breath sounds checked- equal and bilateral Secured at: 21 cm Tube secured with: Tape Dental Injury: Teeth and Oropharynx as per pre-operative assessment

## 2020-09-23 NOTE — Op Note (Signed)
09/23/2020 Cheryl Morrow 04-15-1971 [id number]   PRE-OPERATIVE DIAGNOSIS:     Morbid obesity bmi 38   Essential hypertension   Mild cardiomegaly   Prediabetes   Elevated LDL cholesterol level   Arthritis of knee  POST-OPERATIVE DIAGNOSIS:  same  PROCEDURE:  Procedure(s): Xi robotic SLEEVE GASTRECTOMY  UPPER GI ENDOSCOPY  SURGEON:  Surgeon(s): Gayland Curry, MD FACS FASMBS  ASSISTANTS: Romana Juniper MD FACS, Carlena Hurl PA-C  ANESTHESIA:   general  DRAINS: none   BOUGIE: 40 fr ViSiGi  LOCAL MEDICATIONS USED:   Exparel  EBL: minimal  SPECIMEN:  Source of Specimen:  Greater curvature of stomach  DISPOSITION OF SPECIMEN:  PATHOLOGY  COUNTS:  YES  INDICATION FOR PROCEDURE: This is a very pleasant 49 y.o.-year-old morbidly obese female who has had unsuccessful attempts for sustained weight loss. The patient presents today for a planned laparoscopic sleeve gastrectomy with upper endoscopy. We have discussed the risk and benefits of the procedure extensively preoperatively. Please see my separate notes.  PROCEDURE: After obtaining informed consent and receiving 5000 units of subcutaneous heparin, the patient was brought to the operating room at [facility] and placed supine on the operating room table. General endotracheal anesthesia was established. Sequential compression devices were placed. A orogastric tube was placed. The patient's abdomen was prepped and draped in the usual standard surgical fashion. The patient received preoperative IV antibiotic. A surgical timeout was performed. ERAS protocol used.   Access to the abdomen was achieved using a 5 mm 0 laparoscope thru a 5 mm trocar In the mid clavicular line in the left midabdomen about 3 cm to the left & slight above the level of the umbilicus using the Optiview technique. Pneumoperitoneum was smoothly established up to 15 mm of mercury. The laparoscope was advanced and the abdominal cavity was surveilled. The  patient was then placed in reverse Trendelenburg.  A tap block was performed on patient's right side under direct visualization.  A 12 mm robotic trocar was placed in the mid right abdomen.  The patient was rotated to the right.  The optical entry trocar in the left midabdomen was changed to a 8 mm robotic trocar under direct visualization.  The [facility] liver retractor was placed under the left lobe of the liver through a 5 mm trocar incision site in the subxiphoid position. A final 5 mm trocar was placed in the lateral LUQ.  A tap block was performed along the patient's left side also under direct laparoscopic visualization.     The XI robot was then docked and targeted for upper abdominal anatomy.  Arm 3  was attached to the left mid abdominal trocar and the camera was inserted.  Anatomy was targeted.  Arms 2 and 4 were then attached to the robotic trochars.  A fenestrated bipolar was placed in arm 2.  A vessel sealer was placed in arm 4.  My assistant stayed at the bedside while I went to the robotic console.  The stomach was inspected. It was completely decompressed and the orogastric tube was removed.  There was no small anterior dimple that was obviously visible. Her preop UGI showed no hiatal hernia.    We identified the pylorus and measured 6 cm proximal to the pylorus and identified an area of where we would start taking down the short gastric vessels.  The vessel sealer was used to take down the short gastric vessels along the greater curvature of the stomach. We were able to enter the lesser  sac. We continued to march along the greater curvature of the stomach taking down the short gastrics.  My assistant provided traction laterally to patient's left side.  as we approached the gastrosplenic ligament we took care in this area not to injure the spleen. We were able to take down the entire gastrosplenic ligament. We then mobilized the fundus away from the left crus of diaphragm. There were a few  posterior gastric avascular attachments which were taken down. This left the stomach completely mobilized. No vessels had been taken down along the lesser curvature of the stomach.   We then reidentified the pylorus. A 40Fr ViSiGi was then placed in the oropharynx and advanced down into the stomach and placed in the distal antrum and positioned along the lesser curvature. It was placed under suction which secured the 40Fr ViSiGi in place along the lesser curve. Then using the robotic 60 mm stapler with a green load, I placed a stapler along the antrum approximately 6cm from the pylorus. The stapler was angled so that there is ample room at the angularis incisura. I then fired the first staple load after inspecting it posteriorly to ensure adequate space both anteriorly and posteriorly.  The stapler did have to pause for compression only once so at this point I started using 60 mm blue load staple cartridges. The robotic stapler was then repositioned with a 60 mm blue load  and we continued to march up along the [place]. My assistant was holding traction along the greater curvature stomach along the cauterized short gastric vessels ensuring that the stomach was symmetrically retracted. Prior to each firing of the staple, we rotated the stomach to ensure that there is adequate stomach left.  As we approached the fundus, I used 60 mm blue cartridge aiming  lateral to the GE junction after mobilizing some of the esophageal fat pad.  The sleeve was inspected. There is no evidence of cork screw. The staple line appeared hemostatic except along the first portion of the staple line at the antrum.  The assistant placed several hemoclips at this point there was excellent hemostasis. The CRNA inflated the ViSiGi to the green zone and the upper abdomen was flooded with saline. There were no bubbles. The sleeve was decompressed and the ViSiGi removed.I performed an upper endoscopy.  The endoscope was placed in the patient's  oropharynx and gently glided down.  Z-line at approximately 38 cm.  The endoscope was advanced into the gastric sleeve and insufflated.  I was able to easily advance the endoscope down into the antrum.  Pylorus was visible.  There is no stricture at the incisura.  There was no corkscrewing.  There is no evidence of internal bleeding.  The sleeve was decompressed and the endoscope was removed.   The greater curvature the stomach was grasped with a laparoscopic grasper.  At this point I scrubbed back in.  The robot was undocked.  We removed the specimen from the 12 mm trocar site.  The liver retractor was removed. I then closed the 12 mm trocar site with 1 interrupted 0 Vicryl sutures through the fascia using the PMI suture passer. The closure was viewed laparoscopically and it was airtight. Remaining Exparel was then infiltrated in the preperitoneal spaces around the trocar sites. Pneumoperitoneum was released. All trocar sites were closed with a 4-0 Monocryl in a subcuticular fashion followed by the application of steri strips, gauze and tegaderms by the PA and myself. The patient was extubated and taken to the  recovery room in stable condition. All needle, instrument, and sponge counts were correct x2. There are no immediate complications  (1) 60 mm green  (5) 60 mm blue   PLAN OF CARE: Admit to inpatient   PATIENT DISPOSITION:  PACU - hemodynamically stable.   Delay start of Pharmacological VTE agent (>24hrs) due to surgical blood loss or risk of bleeding:  no  Leighton Ruff. Redmond Pulling, MD, FACS FASMBS General, Bariatric, & Minimally Invasive Surgery [facility], [place]

## 2020-09-23 NOTE — Progress Notes (Signed)
PHARMACY CONSULT FOR:  Risk Assessment for Post-Discharge VTE Following Bariatric Surgery  Post-Discharge VTE Risk Assessment: This patient's probability of 30-day post-discharge VTE is increased due to the factors marked:   Female    Age >/=60 years   x BMI >/=50 kg/m2    CHF    Dyspnea at Rest    Paraplegia  x  Non-gastric-band surgery    Operation Time >/=3 hr    Return to OR     Length of Stay >/= 3 d   Hx of VTE   Hypercoagulable condition   Significant venous stasis       Predicted probability of 30-day post-discharge VTE: 0.27%  Other patient-specific factors to consider:   Recommendation for Discharge: No pharmacologic prophylaxis post-discharge      Cheryl Morrow is a 49 y.o. female who underwent  laparoscopic sleeve gastrectomy on 09/23/20   Case start: 1030 Case end: 1149   Allergies  Allergen Reactions  . Lisinopril Cough    Patient Measurements: Height: 5\' 7"  (170.2 cm) Weight: (!) 171.6 kg (378 lb 6.4 oz) IBW/kg (Calculated) : 61.6 Body mass index is 59.27 kg/m.  Recent Labs    09/23/20 1236  HGB 12.4  HCT 40.6   Estimated Creatinine Clearance: 100.8 mL/min (A) (by C-G formula based on SCr of 1.15 mg/dL (H)).    Past Medical History:  Diagnosis Date  . EKG abnormalities 09/15/2019  . Hypokalemia 09/17/2019  . Mild cardiomegaly 09/15/2019  . Morbid obesity ([place]) 09/15/2019  . Prediabetes 09/17/2019  . Sickle cell trait ([place])   . Uncontrolled hypertension 09/15/2019     Medications Prior to Admission  Medication Sig Dispense Refill Last Dose  . Carbinoxamine Maleate (RYVENT) 6 MG TABS Take 1 tablet by mouth 2 (two) times daily. 180 tablet 0 09/16/2020  . ibuprofen (ADVIL) 200 MG tablet Take 400-600 mg by mouth every 6 (six) hours as needed for headache or moderate pain.   09/16/2020  . losartan-hydrochlorothiazide (HYZAAR) 50-12.5 MG tablet TAKE 1 TABLET BY MOUTH IN THE MORNING AND IN THE EVENING (Patient taking differently: Take  1 tablet by mouth 2 (two) times daily. ) 180 tablet 3 09/22/2020 at 2000  . Multiple Vitamin (MULTIVITAMIN WITH MINERALS) TABS tablet Take 1 tablet by mouth daily.   09/22/2020  . amLODipine (NORVASC) 10 MG tablet Take 1 tablet (10 mg total) by mouth daily. (Patient not taking: Reported on 09/16/2020) 90 tablet 2 Not Taking at Unknown time  . azelastine (ASTELIN) 0.1 % nasal spray Place 2 sprays into both nostrils 2 (two) times daily. (Patient not taking: Reported on 09/16/2020) 30 mL 5 Not Taking at Unknown time       Kara Mead 09/23/2020,1:56 PM

## 2020-09-23 NOTE — Transfer of Care (Signed)
Immediate Anesthesia Transfer of Care Note  Patient: Cheryl Morrow  Procedure(s) Performed: XI ROBOTIC GASTRIC SLEEVE RESECTION (N/A Abdomen) UPPER GI ENDOSCOPY (N/A )  Patient Location: PACU  Anesthesia Type:General  Level of Consciousness: awake, alert  and patient cooperative  Airway & Oxygen Therapy: Patient Spontanous Breathing and Patient connected to nasal cannula oxygen  Post-op Assessment: Report given to RN and Post -op Vital signs reviewed and stable  Post vital signs: Reviewed and stable  Last Vitals:  Vitals Value Taken Time  BP 144/81 09/23/20 1204  Temp    Pulse 74 09/23/20 1209  Resp 16 09/23/20 1209  SpO2 100 % 09/23/20 1209  Vitals shown include unvalidated device data.  Last Pain:  Vitals:   09/23/20 0811  TempSrc: Oral      Patients Stated Pain Goal: 3 (98/11/91 4782)  Complications: No complications documented.

## 2020-09-23 NOTE — Discharge Instructions (Signed)
GASTRIC BYPASS/SLEEVE  Home Care Instructions   These instructions are to help you care for yourself when you go home.  Call: If you have any problems. . Call [phone] and ask for the surgeon on call . If you need immediate help, come to the ER at [facility].  . Tell the ER staff that you are a new post-op gastric bypass or gastric sleeve patient   Signs and symptoms to report: . Severe vomiting or nausea o If you cannot keep down clear liquids for longer than 1 day, call your surgeon  . Abdominal pain that does not get better after taking your pain medication . Fever over 100.4 F with chills . Heart beating over 100 beats a minute . Shortness of breath at rest . Chest pain .  Redness, swelling, drainage, or foul odor at incision (surgical) sites .  If your incisions open or pull apart . Swelling or pain in calf (lower leg) . Diarrhea (Loose bowel movements that happen often), frequent watery, uncontrolled bowel movements . Constipation, (no bowel movements for 3 days) if this happens: Pick one o Milk of Magnesia, 2 tablespoons by mouth, 3 times a day for 2 days if needed o Stop taking Milk of Magnesia once you have a bowel movement o Call your doctor if constipation continues Or o Miralax  (instead of Milk of Magnesia) following the label instructions o Stop taking Miralax once you have a bowel movement o Call your doctor if constipation continues . Anything you think is not normal   Normal side effects after surgery: . Unable to sleep at night or unable to focus . Irritability or moody . Being tearful (crying) or depressed These are common complaints, possibly related to your anesthesia medications that put you to sleep, stress of surgery, and change in lifestyle.  This usually goes away a few weeks after surgery.  If these feelings continue, call your primary care doctor.   Wound Care: You may have surgical glue, steri-strips, or staples over your incisions after  surgery . Surgical glue:  Looks like a clear film over your incisions and will wear off a little at a time . Steri-strips: Strips of tape over your incisions. You may notice a yellowish color on the skin under the steri-strips. This is used to make the   steri-strips stick better. Do not pull the steri-strips off - let them fall off . Staples: Jodell Cipro may be removed before you leave the hospital o If you go home with staples, call [facility], [phone]) [phone] at for an appointment with your surgeon's nurse to have staples removed 10 days after surgery. . Showering: You may shower two (2) days after your surgery unless your surgeon tells you differently o Wash gently around incisions with warm soapy water, rinse well, and gently pat dry  o No tub baths until staples are removed, steri-strips fall off or glue is gone.    Medications: Marland Kitchen Medications should be liquid or crushed if larger than the size of a dime . Extended release pills (medication that release a little bit at a time through the day) should NOT be crushed or cut. (examples include XL, ER, DR, SR) . Depending on the size and number of medications you take, you may need to space (take a few throughout the day)/change the time you take your medications so that you do not over-fill your pouch (smaller stomach) . Make sure you follow-up with your primary care doctor to  make medication changes needed during rapid weight loss and life-style changes . If you have diabetes, follow up with the doctor that orders your diabetes medication(s) within one week after surgery and check your blood sugar regularly. . Do not drive while taking prescription pain medication  . It is ok to take Tylenol by the bottle instructions with your pain medicine or instead of your pain medicine as needed.  DO NOT TAKE NSAIDS (EXAMPLES OF NSAIDS:  IBUPROFREN/ NAPROXEN)  Diet:                    First 2 Weeks  You will see the dietician t about two (2) weeks  after your surgery. The dietician will increase the types of foods you can eat if you are handling liquids well: Marland Kitchen If you have severe vomiting or nausea and cannot keep down clear liquids lasting longer than 1 day, call your surgeon @ [phone]) Protein Shake . Drink at least 2 ounces of shake 5-6 times per day . Each serving of protein shakes (usually 8 - 12 ounces) should have: o 15 grams of protein  o And no more than 5 grams of carbohydrate  . Goal for protein each day: o Men = 80 grams per day o Women = 60 grams per day . Protein powder may be added to fluids such as non-fat milk or Lactaid milk or unsweetened Soy/Almond milk (limit to 35 grams added protein powder per serving)  Hydration . Slowly increase the amount of water and other clear liquids as tolerated (See Acceptable Fluids) . Slowly increase the amount of protein shake as tolerated  .  Sip fluids slowly and throughout the day.  Do not use straws. . May use sugar substitutes in small amounts (no more than 6 - 8 packets per day; i.e. Splenda)  Fluid Goal . The first goal is to drink at least 8 ounces of protein shake/drink per day (or as directed by the nutritionist); some examples of protein shakes are Johnson [employer] Johnson, AMR Corporation, EAS Edge HP, and Unjury. See handout from pre-op Bariatric Education Class: o Slowly increase the amount of protein shake you drink as tolerated o You may find it easier to slowly sip shakes throughout the day o It is important to get your proteins in first . Your fluid goal is to drink 64 - 100 ounces of fluid daily o It may take a few weeks to build up to this . 32 oz (or more) should be clear liquids  And  . 32 oz (or more) should be full liquids (see below for examples) . Liquids should not contain sugar, caffeine, or carbonation  Clear Liquids: . Water or Sugar-free flavored water (i.e. Fruit H2O, Propel) . Decaffeinated coffee or tea (sugar-free) . [employer], [employer],  Minute [facility] . Sugar-free Jell-O . Bouillon or broth . Sugar-free Popsicle:   *Less than 20 calories each; Limit 1 per day  Full Liquids: Protein Shakes/Drinks + 2 choices per day of other full liquids . Full liquids must be: o No More Than 15 grams of Carbs per serving  o No More Than 3 grams of Fat per serving . Strained low-fat cream soup (except Cream of Potato or Tomato) . Non-Fat milk . Fat-free Lactaid Milk . Unsweetened Soy Or Unsweetened Almond Milk . Low Sugar yogurt (Dannon Lite & Fit, [place] yogurt; Oikos Triple Zero; Chobani Simply 100; Yoplait 100 calorie [place] - No Fruit on the Bottom)    Vitamins  and Minerals . Start 1 day after surgery unless otherwise directed by your surgeon . Chewable Bariatric Specific Multivitamin / Multimineral Supplement with iron (Example: Bariatric Advantage Multi EA) . Chewable Calcium with Vitamin D-3 (Example: 3 Chewable Calcium Plus 600 with Vitamin D-3) o Take 500 mg three (3) times a day for a total of 1500 mg each day o Do not take all 3 doses of calcium at one time as it may cause constipation, and you can only absorb 500 mg  at a time  o Do not mix multivitamins containing iron with calcium supplements; take 2 hours apart . Menstruating women and those with a history of anemia (a blood disease that causes weakness) may need extra iron o Talk with your doctor to see if you need more iron . Do not stop taking or change any vitamins or minerals until you talk to your dietitian or surgeon . Your Dietitian and/or surgeon must approve all vitamin and mineral supplements   Activity and Exercise: Limit your physical activity as instructed by your doctor.  It is important to continue walking at home.  During this time, use these guidelines: . Do not lift anything greater than ten (10) pounds for at least two (2) weeks . Do not go back to work or drive until Engineer, production says you can . You may have sex when you feel comfortable  o It is  VERY important for female patients to use a reliable birth control method; fertility often increases after surgery  o All hormonal birth control will be ineffective for 30 days after surgery due to medications given during surgery a barrier method must be used. o Do not get pregnant for at least 18 months . Start exercising as soon as your doctor tells you that you can o Make sure your doctor approves any physical activity . Start with a simple walking program . Walk 5-15 minutes each day, 7 days per week.  . Slowly increase until you are walking 30-45 minutes per day Consider joining our [place] program. [phone] or email [email or web address]   Special Instructions Things to remember: . Use your CPAP when sleeping if this applies to you  . [facility] has two free Bariatric Surgery Support Groups that meet monthly o The 3rd Thursday of each month, 6 pm  . It is very important to keep all follow up appointments with your surgeon, dietitian, primary care physician, and behavioral health practitioner . Routine follow up schedule with your surgeon include appointments at 2-3 weeks, 6-8 weeks, 6 months, and 1 year at a minimum.  Your surgeon may request to see you more often.   . After the first year, please follow up with your bariatric surgeon and dietitian at least once a year in order to maintain best weight loss results   [facility]: [place]: [phone] Bariatric Nurse Coordinator: [phone]      Reviewed and Endorsed  by [facility] Patient Education Committee, June, 2016 Edits Approved: Aug, 2018

## 2020-09-23 NOTE — Progress Notes (Signed)
Water started. Incentive spirometry performed.

## 2020-09-24 ENCOUNTER — Encounter (HOSPITAL_COMMUNITY): Payer: Self-pay | Admitting: General Surgery

## 2020-09-24 LAB — CBC WITH DIFFERENTIAL/PLATELET
Abs Immature Granulocytes: 0.02 10*3/uL (ref 0.00–0.07)
Basophils Absolute: 0 10*3/uL (ref 0.0–0.1)
Basophils Relative: 0 %
Eosinophils Absolute: 0 10*3/uL (ref 0.0–0.5)
Eosinophils Relative: 0 %
HCT: 36 % (ref 36.0–46.0)
Hemoglobin: 11.3 g/dL — ABNORMAL LOW (ref 12.0–15.0)
Immature Granulocytes: 0 %
Lymphocytes Relative: 14 %
Lymphs Abs: 1.4 10*3/uL (ref 0.7–4.0)
MCH: 28 pg (ref 26.0–34.0)
MCHC: 31.4 g/dL (ref 30.0–36.0)
MCV: 89.3 fL (ref 80.0–100.0)
Monocytes Absolute: 1 10*3/uL (ref 0.1–1.0)
Monocytes Relative: 10 %
Neutro Abs: 7.2 10*3/uL (ref 1.7–7.7)
Neutrophils Relative %: 76 %
Platelets: 217 10*3/uL (ref 150–400)
RBC: 4.03 MIL/uL (ref 3.87–5.11)
RDW: 14.4 % (ref 11.5–15.5)
WBC: 9.6 10*3/uL (ref 4.0–10.5)
nRBC: 0 % (ref 0.0–0.2)

## 2020-09-24 LAB — COMPREHENSIVE METABOLIC PANEL
ALT: 26 U/L (ref 0–44)
AST: 22 U/L (ref 15–41)
Albumin: 3.5 g/dL (ref 3.5–5.0)
Alkaline Phosphatase: 62 U/L (ref 38–126)
Anion gap: 10 (ref 5–15)
BUN: 21 mg/dL — ABNORMAL HIGH (ref 6–20)
CO2: 26 mmol/L (ref 22–32)
Calcium: 8.3 mg/dL — ABNORMAL LOW (ref 8.9–10.3)
Chloride: 102 mmol/L (ref 98–111)
Creatinine, Ser: 1.19 mg/dL — ABNORMAL HIGH (ref 0.44–1.00)
GFR, Estimated: 57 mL/min — ABNORMAL LOW (ref >60)
Glucose, Bld: 100 mg/dL — ABNORMAL HIGH (ref 70–99)
Potassium: 3.2 mmol/L — ABNORMAL LOW (ref 3.5–5.1)
Sodium: 138 mmol/L (ref 135–145)
Total Bilirubin: 0.8 mg/dL (ref 0.3–1.2)
Total Protein: 7.4 g/dL (ref 6.5–8.1)

## 2020-09-24 LAB — GLUCOSE, CAPILLARY
Glucose-Capillary: 102 mg/dL — ABNORMAL HIGH (ref 70–99)
Glucose-Capillary: 98 mg/dL (ref 70–99)
Glucose-Capillary: 85 mg/dL (ref 70–99)

## 2020-09-24 MED ORDER — OXYCODONE HCL 5 MG PO TABS: 5.0000 mg | ORAL_TABLET | Freq: Four times a day (QID) | ORAL | 0 refills | Status: DC | PRN

## 2020-09-24 MED ORDER — ONDANSETRON 4 MG PO TBDP: 4.0000 mg | ORAL_TABLET | Freq: Four times a day (QID) | ORAL | 0 refills | Status: DC | PRN

## 2020-09-24 MED ORDER — ACETAMINOPHEN 500 MG PO TABS: 1000.0000 mg | ORAL_TABLET | Freq: Three times a day (TID) | ORAL | 0 refills | Status: AC

## 2020-09-24 MED ORDER — GABAPENTIN 100 MG PO CAPS: 200.0000 mg | ORAL_CAPSULE | Freq: Two times a day (BID) | ORAL | 0 refills | Status: DC

## 2020-09-24 MED ORDER — PANTOPRAZOLE SODIUM 40 MG PO TBEC: 40.0000 mg | DELAYED_RELEASE_TABLET | Freq: Every day | ORAL | 0 refills | Status: DC

## 2020-09-24 NOTE — Progress Notes (Signed)
Patient alert and oriented, Post op day 1.  Provided support and encouragement.  Encouraged pulmonary toilet, ambulation and small sips of liquids.  completed 12 ounces of bari clear fluids.  Starting protein now  All questions answered.  Will continue to monitor.

## 2020-09-24 NOTE — Discharge Summary (Signed)
Physician Discharge Summary  Cheryl Morrow KGY:[id number] DOB: [birth year] DOA: 09/23/2020  PCP: Girtha Rm, NP-C  Admit date: 09/23/2020 Discharge date: 09/24/2020  Recommendations for Outpatient Follow-up:    Follow-up Information    Greer Pickerel, MD. Go on 10/16/2020.   Specialty: General Surgery Why: at 1115 am.  Please arrive 15 minutes prior to appointment time.  Thank you Contact information: [address] [place] [address] [phone]        Carlena Hurl, PA-C. Go on 11/15/2020.   Specialty: General Surgery Why: at 845 am.  Please arrive 15 minutes prior to appointment time.  Thank you Contact information: [place] [address] [phone]              Discharge Diagnoses:  Principal Problem:   Morbid obesity ([place]) Active Problems:   Essential hypertension   Mild cardiomegaly   Prediabetes   Elevated LDL cholesterol level   Arthritis of knee   S/P laparoscopic/robotic  sleeve gastrectomy   Surgical Procedure: Laparoscopic/robotic Sleeve Gastrectomy, upper endoscopy  Discharge Condition: Good Disposition: Home  Diet recommendation: Postoperative sleeve gastrectomy diet (liquids only)  Filed Weights   09/23/20 0811  Weight: (!) 171.6 kg     Hospital Course:  The patient was admitted for a planned laparoscopic/robotic sleeve gastrectomy. Please see operative note. Preoperatively the patient was given 5000 units of subcutaneous heparin for DVT prophylaxis. Postoperative prophylactic Lovenox dosing was started on the evening of postoperative day 0. ERAS protocol was used. On the evening of postoperative day 0, the patient was started on water and ice chips. On postoperative day 1 the patient had no fever or tachycardia and was tolerating water in their diet was gradually advanced throughout the day. The patient was ambulating without difficulty. Their vital signs are stable without fever or tachycardia. Their  hemoglobin had remained stable.  The patient had received discharge instructions and counseling. They were deemed stable for discharge and had met discharge criteria  BP 128/76 (BP Location: Left Arm)   Pulse 80   Temp 98.3 F (36.8 C) (Oral)   Resp (!) 22   Ht '5\' 7"'  (1.702 m)   Wt (!) 171.6 kg   SpO2 96%   BMI 59.27 kg/m   Gen: alert, NAD, non-toxic appearing Pupils: equal, no scleral icterus Pulm: Lungs clear to auscultation, symmetric chest rise CV: regular rate and rhythm Abd: soft, mild approp tender, nondistended.  No cellulitis. No incisional hernia Ext: no edema, no calf tenderness Skin: no rash, no jaundice  Discharge Instructions  Discharge Instructions    Ambulate hourly while awake   Complete by: As directed    Call MD for:  difficulty breathing, headache or visual disturbances   Complete by: As directed    Call MD for:  persistant dizziness or light-headedness   Complete by: As directed    Call MD for:  persistant nausea and vomiting   Complete by: As directed    Call MD for:  redness, tenderness, or signs of infection (pain, swelling, redness, odor or green/yellow discharge around incision site)   Complete by: As directed    Call MD for:  severe uncontrolled pain   Complete by: As directed    Call MD for:  temperature >101 F   Complete by: As directed    Diet bariatric full liquid   Complete by: As directed    Discharge instructions   Complete by: As directed    See bariatric  discharge instructions   Incentive spirometry   Complete by: As directed    Perform hourly while awake     Allergies as of 09/24/2020      Reactions   Lisinopril Cough      Medication List    STOP taking these medications   amLODipine 10 MG tablet Commonly known as: NORVASC   azelastine 0.1 % nasal spray Commonly known as: ASTELIN   ibuprofen 200 MG tablet Commonly known as: ADVIL     TAKE these medications   acetaminophen 500 MG tablet Commonly known as:  TYLENOL Take 2 tablets (1,000 mg total) by mouth every 8 (eight) hours for 5 days.   Carbinoxamine Maleate 6 MG Tabs Commonly known as: RyVent Take 1 tablet by mouth 2 (two) times daily.   gabapentin 100 MG capsule Commonly known as: NEURONTIN Take 2 capsules (200 mg total) by mouth every 12 (twelve) hours.   losartan-hydrochlorothiazide 50-12.5 MG tablet Commonly known as: HYZAAR TAKE 1 TABLET BY MOUTH IN THE MORNING AND IN THE EVENING What changed: See the new instructions.   multivitamin with minerals Tabs tablet Take 1 tablet by mouth daily.   ondansetron 4 MG disintegrating tablet Commonly known as: ZOFRAN-ODT Take 1 tablet (4 mg total) by mouth every 6 (six) hours as needed for nausea or vomiting.   oxyCODONE 5 MG immediate release tablet Commonly known as: Oxy IR/ROXICODONE Take 1 tablet (5 mg total) by mouth every 6 (six) hours as needed for severe pain.   pantoprazole 40 MG tablet Commonly known as: PROTONIX Take 1 tablet (40 mg total) by mouth daily.       Follow-up Information    Greer Pickerel, MD. Go on 10/16/2020.   Specialty: General Surgery Why: at 1115 am.  Please arrive 15 minutes prior to appointment time.  Thank you Contact information: [address] [place] [address] [phone]        Carlena Hurl, PA-C. Go on 11/15/2020.   Specialty: General Surgery Why: at 845 am.  Please arrive 15 minutes prior to appointment time.  Thank you Contact information: [address] [place] [address] [phone]                The results of significant diagnostics from this hospitalization (including imaging, microbiology, ancillary and laboratory) are listed below for reference.    Significant Diagnostic Studies: No results found.  Labs: Basic Metabolic Panel: Recent Labs  Lab 09/19/20 0932 09/24/20 0457  NA 137 138  K 3.0* 3.2*  CL 98 102  CO2 27 26  GLUCOSE 99 100*  BUN 16 21*  CREATININE 1.15* 1.19*   CALCIUM 8.8* 8.3*   Liver Function Tests: Recent Labs  Lab 09/19/20 0932 09/24/20 0457  AST 27 22  ALT 36 26  ALKPHOS 80 62  BILITOT 0.7 0.8  PROT 8.1 7.4  ALBUMIN 3.9 3.5    CBC: Recent Labs  Lab 09/19/20 0932 09/23/20 1236 09/24/20 0457  WBC 5.7  --  9.6  NEUTROABS 2.9  --  7.2  HGB 12.6 12.4 11.3*  HCT 39.9 40.6 36.0  MCV 88.9  --  89.3  PLT 234  --  217    CBG: Recent Labs  Lab 09/23/20 1555 09/23/20 2003 09/23/20 2354 09/24/20 0408 09/24/20 0742  GLUCAP 120* 156* 119* 102* 98    Principal Problem:   Morbid obesity ([place]) Active Problems:   Essential hypertension   Mild cardiomegaly   Prediabetes  Elevated LDL cholesterol level   Arthritis of knee   S/P laparoscopic sleeve gastrectomy   Time coordinating discharge: 15 min  Signed:  Gayland Curry, MD [facility], [place] [phone] 09/24/2020, 10:19 AM

## 2020-09-24 NOTE — Progress Notes (Signed)
D/C instructions given by Dory Peru, RN. Pt d/cd home.

## 2020-09-24 NOTE — Progress Notes (Signed)
Nutrition Note  RD consulted for diet education for patient s/p bariatric surgery. Bariatric nurse coordinator providing education.  If nutrition issues arise, please consult RD.   Avelyn Touch, MS, RD, LDN Inpatient Clinical Dietitian Contact information available via Amion  

## 2020-09-24 NOTE — Progress Notes (Signed)
Patient alert and oriented, pain is controlled. Patient is tolerating fluids, advanced to protein shake today, patient is tolerating well.  Reviewed Gastric sleeve discharge instructions with patient and patient is able to articulate understanding.  Provided information on BELT program, Support Group and [facility] outpatient pharmacy. All questions answered, will continue to monitor.  

## 2020-09-24 NOTE — Plan of Care (Signed)
  Problem: Activity: Goal: Ability to tolerate increased activity will improve Outcome: Progressing   Problem: Bowel/Gastric: Goal: Gastrointestinal status for postoperative course will improve Outcome: Progressing Goal: Occurrences of nausea will decrease Outcome: Progressing   Problem: Coping: Goal: Development of coping mechanisms to deal with changes in body function or appearance will improve Outcome: Progressing   Problem: Fluid Volume: Goal: Maintenance of adequate hydration will improve Outcome: Progressing   Problem: Nutritional: Goal: Nutritional status will improve Outcome: Progressing   Problem: Respiratory: Goal: Will regain and/or maintain adequate ventilation Outcome: Progressing   Problem: Pain Management: Goal: Pain level will decrease Outcome: Progressing   Problem: Skin Integrity: Goal: Demonstration of wound healing without infection will improve Outcome: Progressing

## 2020-09-27 LAB — BPAM RBC
Blood Product Expiration Date: 202112182359
Blood Product Expiration Date: 202112192359
Unit Type and Rh: 5100
Unit Type and Rh: 5100

## 2020-09-27 LAB — TYPE AND SCREEN
ABO/RH(D): O POS
Antibody Screen: POSITIVE
Unit division: 0
Unit division: 0

## 2020-09-30 ENCOUNTER — Telehealth (HOSPITAL_COMMUNITY): Payer: Self-pay

## 2020-09-30 NOTE — Telephone Encounter (Signed)
Patient called to discuss post bariatric surgery follow up questions.  See below:   1.  Tell me about your pain and pain management?  2.  Let's talk about fluid intake.  How much total fluid are you taking in?  3.  How much protein have you taken in the last 2 days?  4.  Have you had nausea?  Tell me about when have experienced nausea and what you did to help?  5.  Has the frequency or color changed with your urine?  6.  Tell me what your incisions look like?  7.  Have you been passing gas? BM?  8.  If a problem or question were to arise who would you call?  Do you know contact numbers for [place] [facility], [facility], and [facility]?  9.  How has the walking going?  10.  How are your vitamins and calcium going?  How are you taking them?

## 2020-10-01 ENCOUNTER — Other Ambulatory Visit: Payer: Self-pay | Admitting: *Deleted

## 2020-10-01 ENCOUNTER — Telehealth: Payer: Self-pay | Admitting: Allergy

## 2020-10-01 MED ORDER — RYVENT 6 MG PO TABS: 1.0000 | ORAL_TABLET | Freq: Two times a day (BID) | ORAL | 5 refills | Status: DC

## 2020-10-01 NOTE — Telephone Encounter (Signed)
Patient called and needs to get a refill on her ryvent , [facility] to [facility] [address]. [phone].

## 2020-10-01 NOTE — Telephone Encounter (Signed)
Left message for patient to discuss refill on Ryvent.

## 2020-10-08 ENCOUNTER — Other Ambulatory Visit: Payer: Self-pay

## 2020-10-08 ENCOUNTER — Encounter: Payer: BC Managed Care – PPO | Attending: General Surgery | Admitting: Skilled Nursing Facility1

## 2020-10-08 DIAGNOSIS — E669 Obesity, unspecified: Secondary | ICD-10-CM | POA: Diagnosis not present

## 2020-10-09 NOTE — Progress Notes (Signed)
2 Week Post-Operative Nutrition Class   Patient was seen on 12/27/18 for Post-Operative Nutrition education at the [facility].    Surgery date: 09/23/2020 Surgery type: Sleeve Start weight at [facility]: 380.7 lb Weight today: 359.2   Body Composition Scale Declined  Total Body Fat %   Visceral Fat   Fat-Free Mass %    Total Body Water %    Muscle-Mass lbs   Body Fat Displacement          Torso  lbs          Left Leg  lbs          Right Leg  lbs          Left Arm  lbs          Right Arm   lbs      The following the learning objectives were met by the patient during this course:  Identifies Phase 3 (Soft, High Proteins) Dietary Goals and will begin from 2 weeks post-operatively to 2 months post-operatively  Identifies appropriate sources of fluids and proteins   Identifies appropriate fat sources and healthy verses unhealthy fat types    States protein recommendations and appropriate sources post-operatively  Identifies the need for appropriate texture modifications, mastication, and bite sizes when consuming solids  Identifies appropriate multivitamin and calcium sources post-operatively  Describes the need for physical activity post-operatively and will follow MD recommendations  States when to call healthcare provider regarding medication questions or post-operative complications   Handouts given during class include:  Phase 3A: Soft, High Protein Diet Handout  Phase 3 High Protein Meals  Healthy Fats   Follow-Up Plan: Patient will follow-up at [facility] in 6 weeks for 2 month post-op nutrition visit for diet advancement per MD.

## 2020-10-14 ENCOUNTER — Telehealth: Payer: Self-pay | Admitting: Skilled Nursing Facility1

## 2020-10-14 NOTE — Telephone Encounter (Signed)
RD called pt to verify fluid intake once starting soft, solid proteins 2 week post-bariatric surgery.   Daily Fluid intake: Daily Protein intake:  Concerns/issues:   LVM 

## 2020-10-15 DIAGNOSIS — D573 Sickle-cell trait: Secondary | ICD-10-CM | POA: Insufficient documentation

## 2020-10-16 ENCOUNTER — Ambulatory Visit (INDEPENDENT_AMBULATORY_CARE_PROVIDER_SITE_OTHER): Payer: BC Managed Care – PPO | Admitting: Cardiology

## 2020-10-16 ENCOUNTER — Ambulatory Visit (HOSPITAL_BASED_OUTPATIENT_CLINIC_OR_DEPARTMENT_OTHER)
Admission: RE | Admit: 2020-10-16 | Discharge: 2020-10-16 | Disposition: A | Discharge status: Home / Self Care | Payer: BC Managed Care – PPO | Source: Ambulatory Visit | Attending: Cardiology | Admitting: Cardiology

## 2020-10-16 ENCOUNTER — Encounter: Payer: Self-pay | Admitting: Cardiology

## 2020-10-16 ENCOUNTER — Other Ambulatory Visit (HOSPITAL_BASED_OUTPATIENT_CLINIC_OR_DEPARTMENT_OTHER): Payer: Self-pay | Admitting: Physical Medicine and Rehabilitation

## 2020-10-16 ENCOUNTER — Other Ambulatory Visit: Payer: Self-pay

## 2020-10-16 VITALS — BP 116/70 | HR 82 | Ht 67.0 in | Wt 347.0 lb

## 2020-10-16 DIAGNOSIS — Z9884 Bariatric surgery status: Secondary | ICD-10-CM

## 2020-10-16 DIAGNOSIS — E7800 Pure hypercholesterolemia, unspecified: Secondary | ICD-10-CM

## 2020-10-16 DIAGNOSIS — I517 Cardiomegaly: Secondary | ICD-10-CM | POA: Diagnosis present

## 2020-10-16 DIAGNOSIS — I10 Essential (primary) hypertension: Secondary | ICD-10-CM

## 2020-10-16 LAB — ECHOCARDIOGRAM COMPLETE
Area-P 1/2: 2.96 cm2
S' Lateral: 2.74 cm

## 2020-10-16 NOTE — Addendum Note (Signed)
Addended by: Senaida Ores on: 10/16/2020 03:05 PM   Modules accepted: Orders

## 2020-10-16 NOTE — Patient Instructions (Signed)
Medication Instructions:  Your physician recommends that you continue on your current medications as directed. Please refer to the Current Medication list given to you today.  *If you need a refill on your cardiac medications before your next appointment, please call your pharmacy*   Lab Work: Your physician recommends that you return for lab work in 3 months: lipid  If you have labs (blood work) drawn today and your tests are completely normal, you will receive your results only by: Marland Kitchen MyChart Message (if you have MyChart) OR . A paper copy in the mail If you have any lab test that is abnormal or we need to change your treatment, we will call you to review the results.   Testing/Procedures: None   Follow-Up: At [facility], you and your health needs are our priority.  As part of our continuing mission to provide you with exceptional heart care, we have created designated Provider Care Teams.  These Care Teams include your primary Cardiologist (physician) and Advanced Practice Providers (APPs -  Physician Assistants and Nurse Practitioners) who all work together to provide you with the care you need, when you need it.  We recommend signing up for the patient portal called "MyChart".  Sign up information is provided on this After Visit Summary.  MyChart is used to connect with patients for Virtual Visits (Telemedicine).  Patients are able to view lab/test results, encounter notes, upcoming appointments, etc.  Non-urgent messages can be sent to your provider as well.   To learn more about what you can do with MyChart, go to [email or web address].    Your next appointment:   6 month(s)  The format for your next appointment:   In Person  Provider:   Jenne Campus, MD   Other Instructions

## 2020-10-16 NOTE — Progress Notes (Signed)
Cardiology Office Note:    Date:  10/16/2020   ID:  Cheryl Morrow, DOB 09-22-1971, MRN [id number]  PCP:  Girtha Rm, NP-C  Cardiologist:  Jenne Campus, MD    Referring MD: Girtha Rm, NP-C   Chief Complaint  Patient presents with  . Follow-up  M doing fine  History of Present Illness:    Cheryl Morrow is a 49 y.o. female with possible history significant for atypical chest pain, stress test showed no evidence of ischemia, essential hypertension, morbid obesity, prediabetes, dyslipidemia.  She comes today to my office recently she had stomach reduction surgery done.  She is recovered quite nicely there was no complication she already lost about 30 pounds.  She is very happy about it.  Denies any chest pain tightness squeezing pressure burning chest.  Past Medical History:  Diagnosis Date  . Arthritis of knee 02/08/2020  . Cardiac murmur 09/19/2019  . Chest discomfort 09/19/2019  . EKG abnormalities 09/15/2019  . Elevated LDL cholesterol level 09/21/2019  . Elevated serum creatinine 09/21/2019  . Essential hypertension 09/15/2019  . Family history of colon cancer 11/26/2015  . Hypertensive disorder 12/02/2015  . Hypokalemia 09/17/2019  . Microcytic anemia 11/26/2015  . Mild cardiomegaly 09/15/2019  . Morbid obesity ([place]) 09/15/2019  . Prediabetes 09/17/2019  . 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 Sickle cell trait ([place])   . Uncontrolled hypertension 09/15/2019  . Uterine leiomyoma 12/02/2015    Past Surgical History:  Procedure Laterality Date  . HYSTERECTOMY ABDOMINAL WITH SALPINGECTOMY    . TUBAL LIGATION    . UPPER GI ENDOSCOPY N/A 09/23/2020   Procedure: UPPER GI ENDOSCOPY;  Surgeon: Greer Pickerel, MD;  Location: [facility];  Service: General;  Laterality: N/A;    Current Medications: Current Meds  Medication Sig  . calcium carbonate (OSCAL) 1500 (600 Ca) MG TABS tablet Take by mouth 2 (two) times daily with a meal.  .  losartan-hydrochlorothiazide (HYZAAR) 50-12.5 MG tablet TAKE 1 TABLET BY MOUTH IN THE MORNING AND IN THE EVENING (Patient taking differently: Take 1 tablet by mouth 2 (two) times daily.)  . Multiple Vitamin (MULTIVITAMIN WITH MINERALS) TABS tablet Take 1 tablet by mouth daily.  . pantoprazole (PROTONIX) 40 MG tablet Take 1 tablet (40 mg total) by mouth daily.  Marland Kitchen RYVENT 6 MG TABS Take 1 tablet by mouth 2 (two) times daily.  . [DISCONTINUED] gabapentin (NEURONTIN) 100 MG capsule Take 2 capsules (200 mg total) by mouth every 12 (twelve) hours.     Allergies:   Lisinopril   Social History   Socioeconomic History  . Marital status: Single    Spouse name: Not on file  . Number of children: Not on file  . Years of education: Not on file  . Highest education level: Not on file  Occupational History  . Not on file  Tobacco Use  . Smoking status: Never Smoker  . Smokeless tobacco: Never Used  Vaping Use  . Vaping Use: Never used  Substance and Sexual Activity  . Alcohol use: Yes    Comment: occasionally  . Drug use: Never  . Sexual activity: Yes  Other Topics Concern  . Not on file  Social History Narrative  . Not on file   Social Determinants of Health   Financial Resource Strain: Not on file  Food Insecurity: Not on file  Transportation Needs: Not on file  Physical Activity: Not on file  Stress: Not on file  Social Connections: Not on file  Family History: The patient's family history includes Arthritis in her mother; Colon cancer in her father; Hypertension in her father, mother, and sister; Other in her brother; Sickle cell trait in her brother. There is no history of Allergic rhinitis, Angioedema, Asthma, Eczema, Atopy, Immunodeficiency, or Urticaria. ROS:   Please see the history of present illness.    All 14 point review of systems negative except as described per history of present illness  EKGs/Labs/Other Studies Reviewed:      Recent Labs: 03/04/2020: TSH  0.715 09/24/2020: ALT 26; BUN 21; Creatinine, Ser 1.19; Hemoglobin 11.3; Platelets 217; Potassium 3.2; Sodium 138  Recent Lipid Panel    Component Value Date/Time   CHOL 182 03/04/2020 1145   TRIG 97 03/04/2020 1145   HDL 51 03/04/2020 1145   CHOLHDL 3.6 03/04/2020 1145   LDLCALC 113 (H) 03/04/2020 1145    Physical Exam:    VS:  BP 116/70 (BP Location: Right Arm, Patient Position: Sitting)   Pulse 82   Ht 5\' 7"  (1.702 m)   Wt (!) 347 lb (157.4 kg)   SpO2 94%   BMI 54.35 kg/m     Wt Readings from Last 3 Encounters:  10/16/20 (!) 347 lb (157.4 kg)  10/09/20 (!) 359 lb 3.2 oz (162.9 kg)  09/23/20 (!) 378 lb 6.4 oz (171.6 kg)     GEN:  Well nourished, well developed in no acute distress HEENT: Normal NECK: No JVD; No carotid bruits LYMPHATICS: No lymphadenopathy CARDIAC: RRR, no murmurs, no rubs, no gallops RESPIRATORY:  Clear to auscultation without rales, wheezing or rhonchi  ABDOMEN: Soft, non-tender, non-distended MUSCULOSKELETAL:  No edema; No deformity  SKIN: Warm and dry LOWER EXTREMITIES: no swelling NEUROLOGIC:  Alert and oriented x 3 PSYCHIATRIC:  Normal affect   ASSESSMENT:    1. Essential hypertension   2. S/P laparoscopic sleeve gastrectomy   3. Morbid obesity ([place])   4. Elevated LDL cholesterol level    PLAN:    In order of problems listed above:  1. Essential hypertension blood pressure well controlled continue present management. 2. Status post laparoscopic sleeve gastrectomy.  Doing well from that point review.  Tolerated surgery quite well with no issues. 3. Morbid obesity again she is a process of losing weight. 4. Elevated LDL.  Will not recheck cholesterol until her weight become more stable which should probably entire 3 to 6 months.  And her cholesterol be rechecked in proper management.  Initiated if needed. 5. We did talk about healthy lifestyle need to exercise on the regular basis which she started getting back. 6. I did review her K PN  from September which show LDL of 139 HDL 53 again I anticipate in the future you to initiate statin but he need to be stabilized after her stomach reduction surgery.   Medication Adjustments/Labs and Tests Ordered: Current medicines are reviewed at length with the patient today.  Concerns regarding medicines are outlined above.  No orders of the defined types were placed in this encounter.  Medication changes: No orders of the defined types were placed in this encounter.   Signed, Park Liter, MD, [facility] 10/16/2020 2:58 PM    [place]

## 2020-10-31 ENCOUNTER — Other Ambulatory Visit: Payer: Self-pay

## 2020-10-31 ENCOUNTER — Ambulatory Visit (INDEPENDENT_AMBULATORY_CARE_PROVIDER_SITE_OTHER): Payer: BC Managed Care – PPO | Admitting: Allergy

## 2020-10-31 ENCOUNTER — Encounter: Payer: Self-pay | Admitting: Allergy

## 2020-10-31 VITALS — BP 132/98 | HR 70 | Temp 97.4°F | Resp 14 | Ht 67.0 in | Wt 359.6 lb

## 2020-10-31 DIAGNOSIS — J3089 Other allergic rhinitis: Secondary | ICD-10-CM | POA: Diagnosis not present

## 2020-10-31 MED ORDER — RYVENT 6 MG PO TABS
1.0000 | ORAL_TABLET | Freq: Two times a day (BID) | ORAL | 5 refills | Status: DC
Start: 2020-10-31 — End: 2020-12-17

## 2020-10-31 NOTE — Patient Instructions (Addendum)
Allergic rhinitis - continue avoidance measures for grasses, weeds, trees, molds, cat, cockroach.  - use nasal antihistamine, Astelin 2 sprays each nostril twice a day as needed for nasal drainage control.    - use antihistamine Ryvent 6mg  twice a day as needed at this time.  Recommend starting back 1-2 times a day use mid-to-late February in anticipation for spring (pollen season) - if allergy medication regimen is not effective enough consider course of allergen immunotherapy  Follow-up in 12 months or sooner if needed

## 2020-10-31 NOTE — Progress Notes (Signed)
    Follow-up Note  RE: Cheryl Morrow MRN: [id number] DOB: [birth year] Date of Office Visit: 10/31/2020   History of present illness: Cheryl Morrow is a 49 y.o. female presenting today for follow-up of allergic rhinitis.  She was last seen in the office on 03/13/20 by myself.  Since this visit she did have gastric sleeve surgery on 09/23/20 and procedure went well.   She states with her allergies she has been doing well and has not been sneezing, coughing and not having nasal congestion/drainage.   She hasn't been on Ryvent in the past month.  There unfortunately been a shortage on Ryvent and is awaiting shipment to the pharmacy. She hasn't noticed any increase in allergy symptoms off Ryvent. She also can't remember the last time she used her nasal spray, Astelin.   Review of systems: Review of Systems  Constitutional: Negative.   HENT: Negative.   Eyes: Negative.   Respiratory: Negative.   Cardiovascular: Negative.   Gastrointestinal: Negative.   Musculoskeletal: Negative.   Skin: Negative.   Neurological: Negative.     All other systems negative unless noted above in HPI  Past medical/social/surgical/family history have been reviewed and are unchanged unless specifically indicated below.  see HPI  Medication List: Current Outpatient Medications  Medication Sig Dispense Refill  . calcium carbonate (OSCAL) 1500 (600 Ca) MG TABS tablet Take by mouth 2 (two) times daily with a meal.    . losartan-hydrochlorothiazide (HYZAAR) 50-12.5 MG tablet TAKE 1 TABLET BY MOUTH IN THE MORNING AND IN THE EVENING (Patient taking differently: Take 1 tablet by mouth 2 (two) times daily.) 180 tablet 3  . Multiple Vitamin (MULTIVITAMIN WITH MINERALS) TABS tablet Take 1 tablet by mouth daily.    . pantoprazole (PROTONIX) 40 MG tablet Take 1 tablet (40 mg total) by mouth daily. 90 tablet 0  . RYVENT 6 MG TABS Take 1 tablet by mouth 2 (two) times daily. 180 tablet 5   No current facility-administered  medications for this visit.     Known medication allergies: Allergies  Allergen Reactions  . Lisinopril Cough     Physical examination: Blood pressure (!) 132/98, pulse 70, temperature (!) 97.4 F (36.3 C), resp. rate 14, height 5\' 7"  (1.702 m), weight (!) 359 lb 9.6 oz (163.1 kg), SpO2 97 %.  General: Alert, interactive, in no acute distress. HEENT: PERRLA, TMs pearly gray, turbinates non-edematous without discharge, post-pharynx non erythematous. Neck: Supple without lymphadenopathy. Lungs: Clear to auscultation without wheezing, rhonchi or rales. {no increased work of breathing. CV: Normal S1, S2 without murmurs. Abdomen: Nondistended, nontender. Skin: Warm and dry, without lesions or rashes. Extremities:  No clubbing, cyanosis or edema. Neuro:   Grossly intact.  Diagnositics/Labs: None today  Assessment and plan:   Allergic rhinitis - continue avoidance measures for grasses, weeds, trees, molds, cat, cockroach.  - use nasal antihistamine, Astelin 2 sprays each nostril twice a day as needed for nasal drainage control.    - use antihistamine Ryvent 6mg  twice a day as needed at this time.  Recommend starting back 1-2 times a day use mid-to-late February in anticipation for spring (pollen season) - if allergy medication regimen is not effective enough consider course of allergen immunotherapy  Follow-up in 12 months or sooner if needed  I appreciate the opportunity to take part in Cheryl Morrow's care. Please do not hesitate to contact me with questions.  Sincerely,   March, MD Allergy/Immunology [facility]

## 2020-11-14 ENCOUNTER — Telehealth: Payer: Self-pay

## 2020-11-14 NOTE — Telephone Encounter (Signed)
PA initiated through [email or web address] for Ryvent 6 mg. Pending approval

## 2020-11-15 IMAGING — MG DIGITAL SCREENING BILAT W/ CAD
5 series · 5 of 5 positions shown · non-contrast
Comparison: Previous exam(s).

CLINICAL DATA: Screening.

EXAM:
DIGITAL SCREENING BILATERAL MAMMOGRAM WITH CAD

[L MLO]
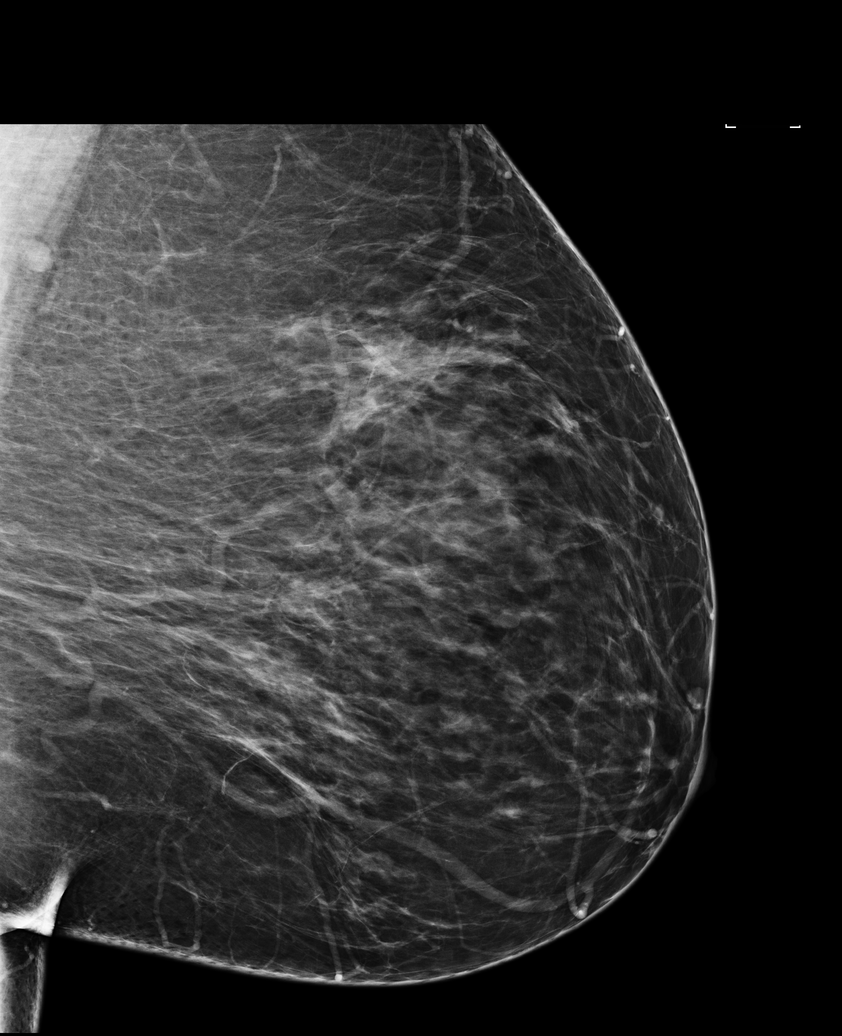

[L CC]
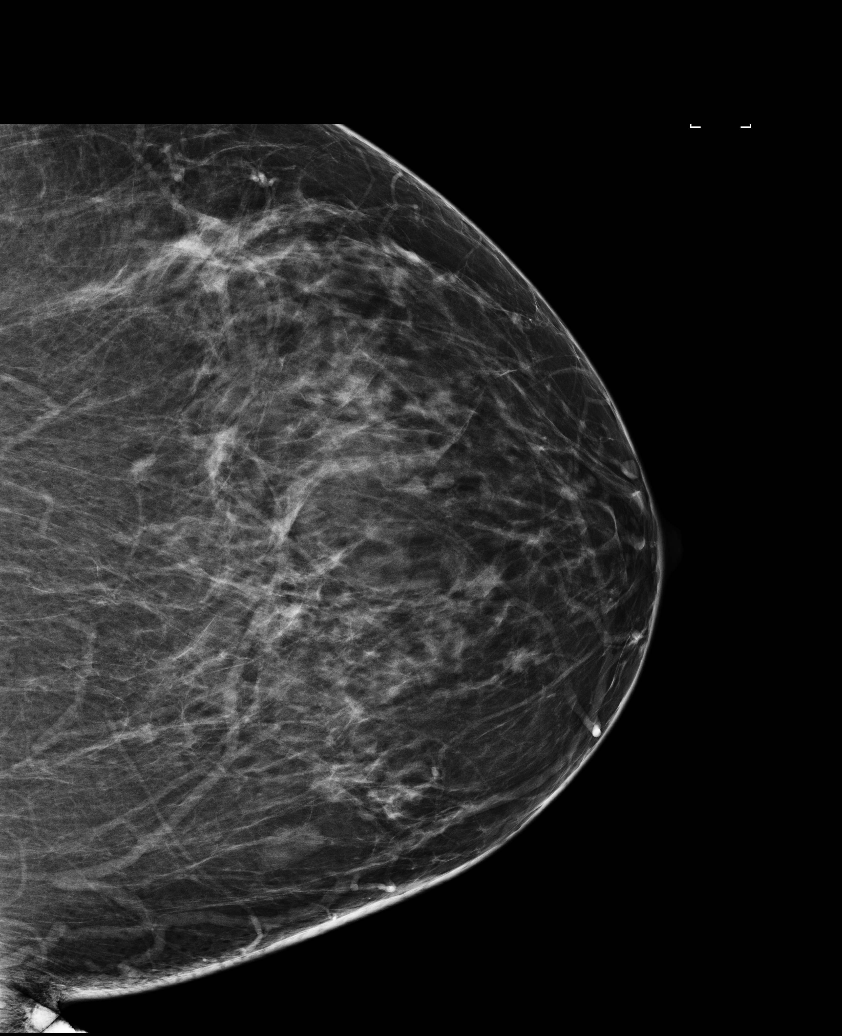

[R CC]
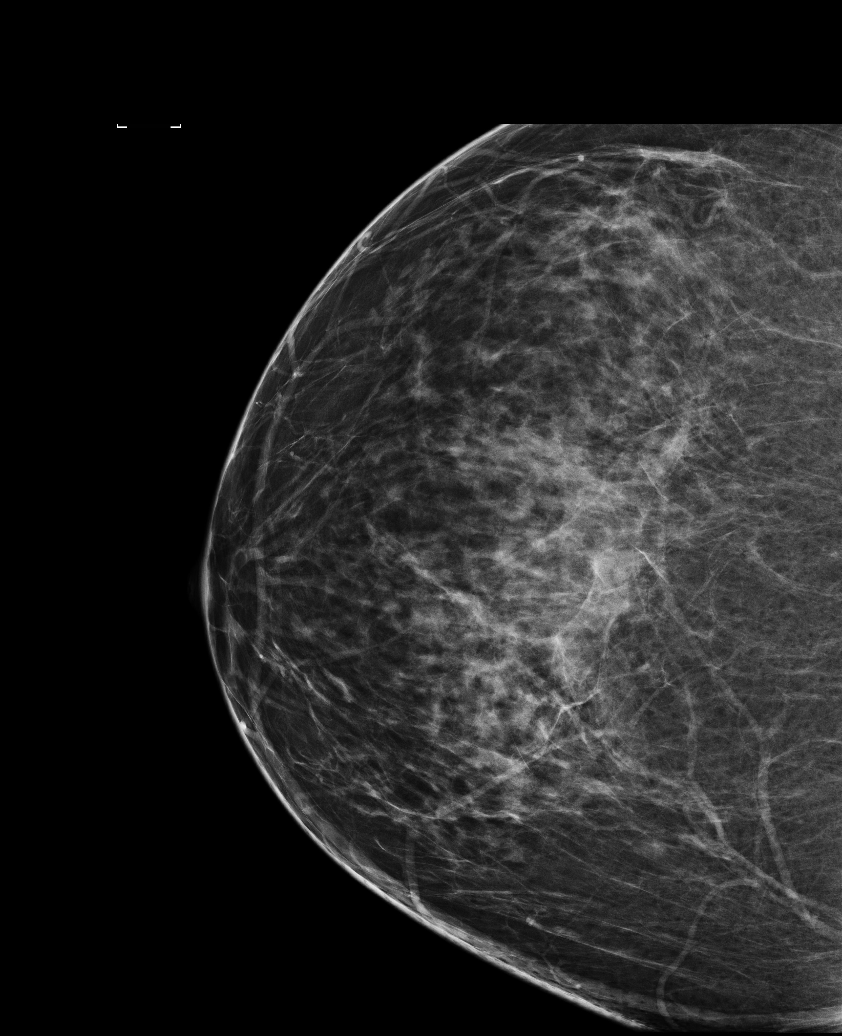

[R MLO]
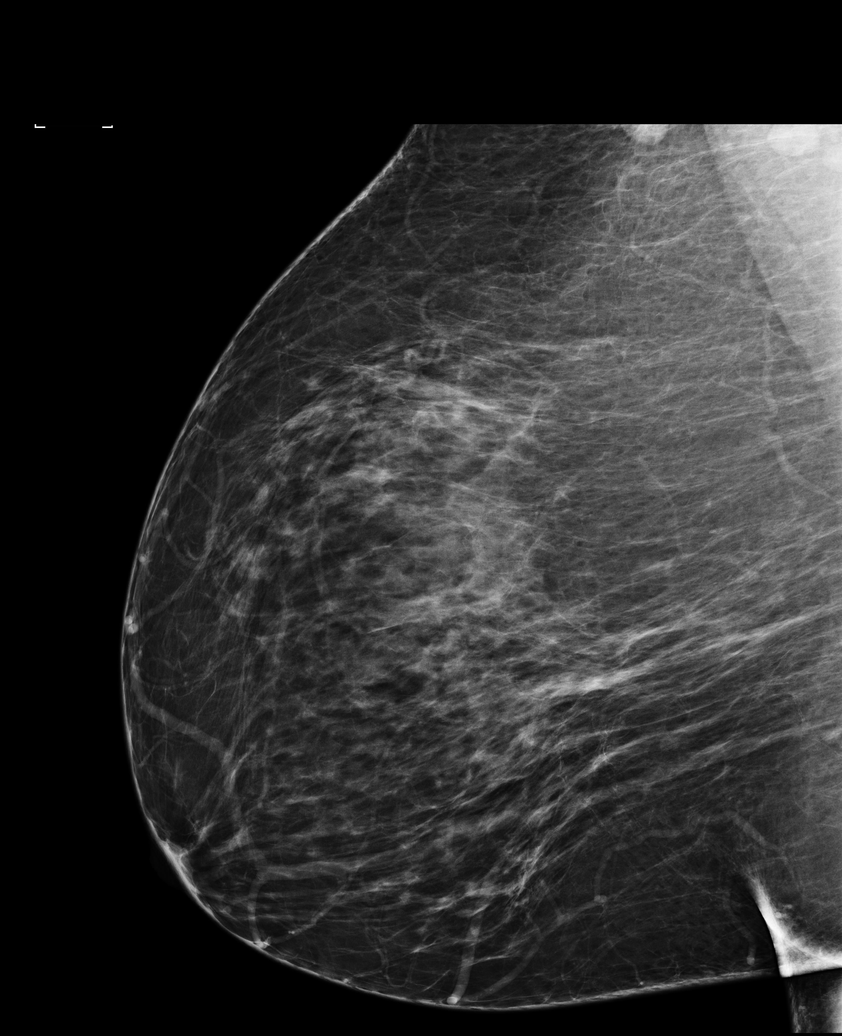

[R CV]
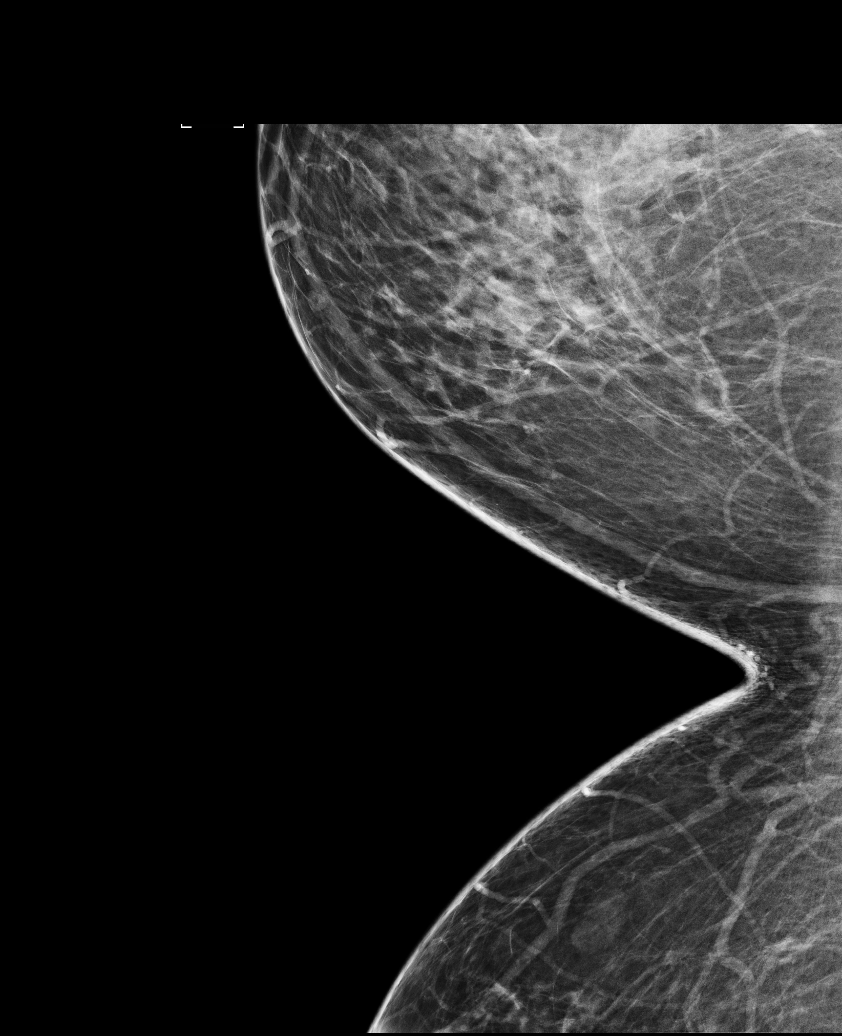

[5 of 5 positions shown; findings below may reference images not displayed]

ACR Breast Density Category c: The breast tissue is heterogeneously
dense, which may obscure small masses.
FINDINGS: There are no findings suspicious for malignancy. Images were
processed with CAD.
IMPRESSION: No mammographic evidence of malignancy. A result letter of this
screening mammogram will be mailed directly to the patient.

RECOMMENDATION:
Screening mammogram in one year. (Code:[id number])

BI-RADS CATEGORY  1: Negative.

## 2020-11-15 NOTE — Telephone Encounter (Signed)
Still pending

## 2020-11-19 ENCOUNTER — Ambulatory Visit: Payer: BC Managed Care – PPO | Admitting: Skilled Nursing Facility1

## 2020-11-19 NOTE — Telephone Encounter (Signed)
PA has been approved for Ryvent. PA has been faxed to pharmacy, labeled, and placed in bulk scanning.

## 2020-12-12 ENCOUNTER — Other Ambulatory Visit: Payer: Self-pay

## 2020-12-12 ENCOUNTER — Encounter: Payer: BC Managed Care – PPO | Attending: General Surgery | Admitting: Skilled Nursing Facility1

## 2020-12-12 NOTE — Progress Notes (Signed)
Bariatric Nutrition Follow-Up Visit Medical Nutrition Therapy    NUTRITION ASSESSMENT  Anthropometrics  Surgery date: 09/23/2020 Surgery type: Sleeve Start weight at [facility]: 380.7 lb Weight today: 332.1 pounds   Body Composition Scale 12/12/2020  Total Body Fat % 50.5  Visceral Fat 22  Fat-Free Mass % 49.4   Total Body Water % 39.2   Muscle-Mass lbs 33.7  Body Fat Displacement          Torso  lbs 104.2         Left Leg  lbs 20.8         Right Leg  lbs 20.8         Left Arm  lbs 10.4         Right Arm   lbs 10.4    Clinical  Medical hx: HTN Medications:  Labs:    Lifestyle & Dietary Hx  Pt states "its a process I'm working on it" recognizing juice is concentrated with sugar.   Pt state she thinks she blanked out and did whatever as far as what foods she is eating.  Pt states she is more conscious about what she eats and how much she eats stating she is okay with throwing things away.   Estimated daily fluid intake: 64-90 oz 9(at the end of the appointment this is actually the goal not the reality) Estimated daily protein intake: unknown g Supplements: [employer] multi and calcium  Current average weekly physical activity: 3-4 times a week personal trainer   24-Hr Dietary Recall First Meal: sugar free maple oatmeal  Snack:  Second Meal: broccoli or green beans Snack: banana Third Meal: chicken or fish Snack:  Beverages: water, water + flavorings, regular juice  Post-Op Goals/ Signs/ Symptoms Using straws: no Drinking while eating: no Chewing/swallowing difficulties: no Changes in vision: no Changes to mood/headaches: no Hair loss/changes to skin/nails: no Difficulty focusing/concentrating: no Sweating: no Dizziness/lightheadedness: no Palpitations: no  Carbonated/caffeinated beverages: no N/V/D/C/Gas: no Abdominal pain: no Dumping syndrome: no    NUTRITION DIAGNOSIS  Overweight/obesity ([place]-3.3) related to past poor dietary habits and physical  inactivity as evidenced by completed bariatric surgery and following dietary guidelines for continued weight loss and healthy nutrition status.   NUTRITION INTERVENTION Nutrition counseling (C-1) and education (E-2) to facilitate bariatric surgery goals, including: . The importance of consuming adequate calories as well as certain nutrients daily due to the body's need for essential vitamins, minerals, and fats . The importance of daily physical activity and to reach a goal of at least 150 minutes of moderate to vigorous physical activity weekly (or as directed by their physician) due to benefits such as increased musculature and improved lab values . The importance of intuitive eating specifically learning hunger-satiety cues and understanding the importance of learning a new body: The importance of mindful eating to avoid grazing behaviors   Goals: -Stop eating when satisfied not necessarily full -Add an egg to breakfast or choose a high protein oatmeal product  -get the appropriate multivitamin -follow the appropriate balanced meals do not leave out protein or non starchy vegetables  -Log everything you put into your mouth including beverages  -Limit nuts to 1/4 cup per day -Limit to half a banana   Handouts Provided Include   Bariatric MyPlate  Learning Style & Readiness for Change Teaching method utilized: Visual & Auditory  Demonstrated degree of understanding via: Teach Back  Readiness Level: pre-contemplative  Barriers to learning/adherence to lifestyle change: yes   RD's Notes for Next Visit .  Assess adherence to pt chosen goals   MONITORING & EVALUATION Dietary intake, weekly physical activity, body weight  Next Steps Patient is to follow-up in 2 months

## 2020-12-17 ENCOUNTER — Other Ambulatory Visit: Payer: Self-pay | Admitting: Allergy

## 2021-02-10 ENCOUNTER — Encounter: Payer: BC Managed Care – PPO | Attending: General Surgery | Admitting: Skilled Nursing Facility1

## 2021-02-10 ENCOUNTER — Other Ambulatory Visit: Payer: Self-pay

## 2021-02-10 NOTE — Progress Notes (Signed)
Bariatric Nutrition Follow-Up Visit Medical Nutrition Therapy    NUTRITION ASSESSMENT  Anthropometrics  Surgery date: 09/23/2020 Surgery type: Sleeve Start weight at [facility]: 380.7 lb Weight today: 313.8 pounds   Body Composition Scale 12/12/2020 02/10/2021  Total Body Fat % 50.5 48.9  Visceral Fat 22 20  Fat-Free Mass % 49.4 51   Total Body Water % 39.2 40   Muscle-Mass lbs 33.7 34.4  Body Fat Displacement           Torso  lbs 104.2 95.3         Left Leg  lbs 20.8 19         Right Leg  lbs 20.8 19         Left Arm  lbs 10.4 9.5         Right Arm   lbs 10.4 9.5    Clinical  Medical hx: HTN Medications: reviewed Labs: no new labs in emr   Lifestyle & Dietary Hx  Pt states she feels heavy after drinking water. Pt states she is better at making concious efforts with her beverage choice. Pt state she knows she cannot fit much so she wants  Her food to be perfect; taste is extremely important. Pt states she avoids beef due to how it makes her feel, avoids starchy stuff like bread or potatoes stating something with bread on it makes her feel to full.   Estimated daily fluid intake:  Estimated daily protein intake: unknown g Supplements: [employer] multi and calcium  Current average weekly physical activity: 3-4 times a week personal trainer 60 minutes   24-Hr Dietary Recall: crackers 2 times a week First Meal: sugar free maple oatmeal or protein shake or 1 egg + yogurt Snack:  Second Meal 2-3: protein shake or olive garden salad + grilled chicken Snack: banana Third Meal: protein shake  Snack:  Beverages: water, water + flavorings, regular juice (reducing it)  Post-Op Goals/ Signs/ Symptoms Using straws: no Drinking while eating: no Chewing/swallowing difficulties: no Changes in vision: no Changes to mood/headaches: no Hair loss/changes to skin/nails: no Difficulty focusing/concentrating: no Sweating: no Dizziness/lightheadedness: no Palpitations: no   Carbonated/caffeinated beverages: no N/V/D/C/Gas: no Abdominal pain: no Dumping syndrome: no    NUTRITION DIAGNOSIS  Overweight/obesity ([place]-3.3) related to past poor dietary habits and physical inactivity as evidenced by completed bariatric surgery and following dietary guidelines for continued weight loss and healthy nutrition status.   NUTRITION INTERVENTION Nutrition counseling (C-1) and education (E-2) to facilitate bariatric surgery goals, including: . The importance of consuming adequate calories as well as certain nutrients daily due to the body's need for essential vitamins, minerals, and fats . The importance of daily physical activity and to reach a goal of at least 150 minutes of moderate to vigorous physical activity weekly (or as directed by their physician) due to benefits such as increased musculature and improved lab values . The importance of intuitive eating specifically learning hunger-satiety cues and understanding the importance of learning a new body: The importance of mindful eating to avoid grazing behaviors   Goals: continued -Stop eating when satisfied not necessarily full -Add an egg to breakfast or choose a high protein oatmeal product  -get the appropriate multivitamin -follow the appropriate balanced meals do not leave out protein or non starchy vegetables  -Log everything you put into your mouth including beverages  -Limit nuts to 1/4 cup per day -Limit to half a banana  -Try alkaline water or unsweet tea -choose whole grain or whole  wheat crackers  Handouts Previously Provided Include   Bariatric MyPlate  Learning Style & Readiness for Change Teaching method utilized: Visual & Auditory  Demonstrated degree of understanding via: Teach Back  Readiness Level: action Barriers to learning/adherence to lifestyle change: none identified at this time  RD's Notes for Next Visit . Assess adherence to pt chosen goals   MONITORING & EVALUATION Dietary  intake, weekly physical activity, body weight  Next Steps Patient is to follow-up

## 2021-04-21 ENCOUNTER — Ambulatory Visit (INDEPENDENT_AMBULATORY_CARE_PROVIDER_SITE_OTHER): Payer: BC Managed Care – PPO | Admitting: Cardiology

## 2021-04-21 ENCOUNTER — Other Ambulatory Visit: Payer: Self-pay

## 2021-04-21 ENCOUNTER — Encounter: Payer: Self-pay | Admitting: Cardiology

## 2021-04-21 VITALS — BP 110/80 | HR 66 | Ht 67.0 in | Wt 288.0 lb

## 2021-04-21 DIAGNOSIS — E7800 Pure hypercholesterolemia, unspecified: Secondary | ICD-10-CM

## 2021-04-21 DIAGNOSIS — I10 Essential (primary) hypertension: Secondary | ICD-10-CM | POA: Diagnosis not present

## 2021-04-21 DIAGNOSIS — Z9884 Bariatric surgery status: Secondary | ICD-10-CM

## 2021-04-21 NOTE — Addendum Note (Signed)
Addended by: Senaida Ores on: 04/21/2021 08:48 AM   Modules accepted: Orders

## 2021-04-21 NOTE — Patient Instructions (Signed)
Medication Instructions:  Your physician recommends that you continue on your current medications as directed. Please refer to the Current Medication list given to you today.  *If you need a refill on your cardiac medications before your next appointment, please call your pharmacy*   Lab Work: Your physician recommends that you return for lab work today: lipid  If you have labs (blood work) drawn today and your tests are completely normal, you will receive your results only by: [place] (if you have MyChart) OR A paper copy in the mail If you have any lab test that is abnormal or we need to change your treatment, we will call you to review the results.   Testing/Procedures: None   Follow-Up: At [facility], you and your health needs are our priority.  As part of our continuing mission to provide you with exceptional heart care, we have created designated Provider Care Teams.  These Care Teams include your primary Cardiologist (physician) and Advanced Practice Providers (APPs -  Physician Assistants and Nurse Practitioners) who all work together to provide you with the care you need, when you need it.  We recommend signing up for the patient portal called "MyChart".  Sign up information is provided on this After Visit Summary.  MyChart is used to connect with patients for Virtual Visits (Telemedicine).  Patients are able to view lab/test results, encounter notes, upcoming appointments, etc.  Non-urgent messages can be sent to your provider as well.   To learn more about what you can do with MyChart, go to [email or web address].    Your next appointment:   1 year(s)  The format for your next appointment:   In Person  Provider:   Jenne Campus, MD   Other Instructions

## 2021-04-21 NOTE — Progress Notes (Signed)
Cardiology Office Note:    Date:  04/21/2021   ID:  Cheryl Morrow, DOB 05-05-1971, MRN [id number]  PCP:  [place]  Cardiologist:  Jenne Campus, MD    Referring MD: Girtha Rm, NP-C   Chief Complaint  Patient presents with   Follow-up  I am doing great  History of Present Illness:    Cheryl Morrow is a 51 y.o. female with past medical history significant for atypical chest pain, stress test no evidence of ischemia, essential hypertension, prediabetes, dyslipidemia, morbid obesity.  She presented to [place] with atypical chest pain, stress test was done which showed no evidence of ischemia she is today for follow-up.  She did have gastric sleeve surgery since that time she lost a lot of weight more than 100 pounds and she is feeling great.  Denies have any chest pain, tightness, pressure, burning in her chest.  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   losartan-hydrochlorothiazide (HYZAAR) 50-12.5 MG tablet TAKE 1 TABLET BY MOUTH IN THE MORNING AND IN THE EVENING   Multiple Vitamin (MULTIVITAMIN WITH MINERALS) TABS tablet Take 1 tablet by mouth daily.   pantoprazole (PROTONIX) 40 MG tablet Take 1 tablet (40 mg total) by mouth daily.   RYVENT 6 MG TABS TAKE 1 TABLET BY MOUTH TWICE DAILY     Allergies:   Lisinopril   Social History   Socioeconomic History   Marital status: Single    Spouse name: Not on file   Number of children: Not on file   Years of education: Not on file   Highest education level: Not on file  Occupational History   Not on file  Tobacco Use   Smoking status: Never   Smokeless tobacco: Never  Vaping Use   Vaping Use: Never used  Substance and Sexual Activity   Alcohol use: Yes    Comment: occasionally   Drug use: Never   Sexual activity: Yes  Other Topics Concern   Not on file  Social History Narrative   Not on file   Social Determinants of Health   Financial Resource Strain: Not on file  Food Insecurity: Not on file  Transportation Needs: Not on file  Physical Activity: Not on file  Stress: Not on file  Social Connections: Not on file     Family History: The patient's  family history includes Arthritis in her mother; Colon cancer in her father; Hypertension in her father, mother, and sister; Other in her brother; Sickle cell trait in her brother. There is no history of Allergic rhinitis, Angioedema, Asthma, Eczema, Atopy, Immunodeficiency, or Urticaria. ROS:   Please see the history of present illness.    All 14 point review of systems negative except as described per history of present illness  EKGs/Labs/Other Studies Reviewed:      Recent Labs: 09/24/2020: ALT 26; BUN 21; Creatinine, Ser 1.19; Hemoglobin 11.3; Platelets 217; Potassium 3.2; Sodium 138  Recent Lipid Panel     Component Value Date/Time   CHOL 182 03/04/2020 1145   TRIG 97 03/04/2020 1145   HDL 51 03/04/2020 1145   CHOLHDL 3.6 03/04/2020 1145   LDLCALC 113 (H) 03/04/2020 1145    Physical Exam:    VS:  BP 110/80 (BP Location: Left Arm, Patient Position: Sitting, Cuff Size: Large)   Pulse 66   Ht 5\' 7"  (1.702 m)   Wt 288 lb (130.6 kg)   SpO2 99%   BMI 45.11 kg/m     Wt Readings from Last 3 Encounters:  04/21/21 288 lb (130.6 kg)  02/10/21 (!) 313 lb 12.8 oz (142.3 kg)  12/12/20 (!) 332 lb 1.6 oz (150.6 kg)     GEN:  Well nourished, well developed in no acute distress HEENT: Normal NECK: No JVD; No carotid bruits LYMPHATICS: No lymphadenopathy CARDIAC: RRR, no murmurs, no rubs, no gallops RESPIRATORY:  Clear to auscultation without rales, wheezing or rhonchi  ABDOMEN: Soft, non-tender, non-distended MUSCULOSKELETAL:  No edema; No deformity  SKIN: Warm and dry LOWER EXTREMITIES: no swelling NEUROLOGIC:  Alert and oriented x 3 PSYCHIATRIC:  Normal affect   ASSESSMENT:    1. Essential hypertension   2. S/P laparoscopic sleeve gastrectomy   3. Morbid obesity ([place])   4. Elevated LDL cholesterol level    PLAN:    In order of problems listed above:  Essential hypertension: Blood pressure seems to be well controlled. Status post laparoscopic sleeve gastrectomy.  Doing very well from that point review losing significant mount of weight and continues losing feeling dramatically better. Morbid obesity it seems to be under control right now.  Still losing weight. Dyslipidemia: We will check fasting lipid profile. We did talk about healthy lifestyle need to exercise on the regular basis which she already does.  Pain is amazing how many health problems she had improved dramatically after significant weight loss. I see her back in about 1 year. I did review her K PN which show me LDL of 139 HDL 53 but will check cholesterol again after significant weight loss   Medication  Adjustments/Labs and Tests Ordered: Current medicines are reviewed at length with the patient today.  Concerns regarding medicines are outlined above.  No orders of the defined types were placed in this encounter.  Medication changes: No orders of the defined types were placed in this encounter.   Signed, Park Liter, MD, [facility] 04/21/2021 8:24 AM    [place]

## 2021-04-22 LAB — LIPID PANEL
Chol/HDL Ratio: 3.4 ratio (ref 0.0–4.4)
Cholesterol, Total: 179 mg/dL (ref 100–199)
HDL: 52 mg/dL (ref >39)
LDL Chol Calc (NIH): 110 mg/dL — ABNORMAL HIGH (ref 0–99)
Triglycerides: 92 mg/dL (ref 0–149)
VLDL Cholesterol Cal: 17 mg/dL (ref 5–40)

## 2021-04-23 ENCOUNTER — Telehealth: Payer: Self-pay | Admitting: Emergency Medicine

## 2021-04-23 DIAGNOSIS — E7800 Pure hypercholesterolemia, unspecified: Secondary | ICD-10-CM

## 2021-04-23 MED ORDER — PRAVASTATIN SODIUM 20 MG PO TABS: 20.0000 mg | ORAL_TABLET | Freq: Every evening | ORAL | 1 refills | Status: DC

## 2021-04-23 NOTE — Telephone Encounter (Signed)
-----   Message from Park Liter, MD sent at 04/23/2021  9:51 AM EDT ----- Cholesterol is elevated I will suggest to initiate some statin.  Have that we start with 20 mg of pravastatin daily, fasting lipid profile in 6 weeks

## 2021-04-23 NOTE — Telephone Encounter (Signed)
Called patient informed her of results. She will start pravastatin 20 mg daily. She will repeat labs in 6 weeks. No further questions.

## 2021-05-12 ENCOUNTER — Other Ambulatory Visit: Payer: Self-pay

## 2021-05-12 ENCOUNTER — Encounter: Payer: BC Managed Care – PPO | Attending: General Surgery | Admitting: Skilled Nursing Facility1

## 2021-05-12 DIAGNOSIS — E669 Obesity, unspecified: Secondary | ICD-10-CM | POA: Diagnosis present

## 2021-05-12 NOTE — Progress Notes (Signed)
Bariatric Nutrition Follow-Up Visit Medical Nutrition Therapy    NUTRITION ASSESSMENT  Anthropometrics  Surgery date: 09/23/2020 Surgery type: Sleeve Start weight at [facility]: 380.7 lb Weight today: 292.7 pounds   Body Composition Scale 12/12/2020 02/10/2021 05/12/2021  Total Body Fat % 50.5 48.9 47.4  Visceral Fat 22 20 18   Fat-Free Mass % 49.4 51 52.5   Total Body Water % 39.2 40 40.7   Muscle-Mass lbs 33.7 34.4 34.2  Body Fat Displacement            Torso  lbs 104.2 95.3 86.2         Left Leg  lbs 20.8 19 17.2         Right Leg  lbs 20.8 19 17.2         Left Arm  lbs 10.4 9.5 8.6         Right Arm   lbs 10.4 9.5 8.6    Clinical  Medical hx: HTN Medications: pravastatin was added Labs: LDL cholesterol 110   Lifestyle & Dietary Hx  Pt state she noticed a change in her drinking so she is trying to keep drinking more. Pt states she will not eat 2 starches at once so she will choose what she wants more.  Pt state she has made these food choices because she connected how she feels with how she eats. Pt states everything really feels like it is falling into place and hopes to get off her blood pressure medicine. Pt states she does not have reflux.   Estimated daily fluid intake: 55 oz Estimated daily protein intake: 55-60 g Supplements: [employer] multi and calcium  Current average weekly physical activity: 3-4 times a week personal trainer 60 minutes   24-Hr Dietary Recall: sometimes a protein shake when no time to get in a meal  First Meal: fruit or oatmeal  Snack: veggie trays or fruit Second Meal 2-3: salad + chicken or steamed vegetables  Snack: banana Third Meal: hamburger + green beans + corn bread  Snack: fruit or vegetable  Beverages: water, water + flavorings, regular juice (reducing it), hot tea + splenda or honey  Post-Op Goals/ Signs/ Symptoms Using straws: no Drinking while eating: no Chewing/swallowing difficulties: no Changes in vision: no Changes to  mood/headaches: no Hair loss/changes to skin/nails: no Difficulty focusing/concentrating: no Sweating: no Dizziness/lightheadedness: no Palpitations: no  Carbonated/caffeinated beverages: no N/V/D/C/Gas: no Abdominal pain: no Dumping syndrome: no    NUTRITION DIAGNOSIS  Overweight/obesity ([place]-3.3) related to past poor dietary habits and physical inactivity as evidenced by completed bariatric surgery and following dietary guidelines for continued weight loss and healthy nutrition status.   NUTRITION INTERVENTION Nutrition counseling (C-1) and education (E-2) to facilitate bariatric surgery goals, including: The importance of consuming adequate calories as well as certain nutrients daily due to the body's need for essential vitamins, minerals, and fats The importance of daily physical activity and to reach a goal of at least 150 minutes of moderate to vigorous physical activity weekly (or as directed by their physician) due to benefits such as increased musculature and improved lab values The importance of intuitive eating specifically learning hunger-satiety cues and understanding the importance of learning a new body: The importance of mindful eating to avoid grazing behaviors  Encouraged patient to honor their body's internal hunger and fullness cues.  Throughout the day, check in mentally and rate hunger. Stop eating when satisfied not full regardless of how much food is left on the plate.  Get more if still  hungry 20-30 minutes later.  The key is to honor satisfaction so throughout the meal, rate fullness factor and stop when comfortably satisfied not physically full. The key is to honor hunger and fullness without any feelings of guilt or shame.  Pay attention to what the internal cues are, rather than any external factors. This will enhance the confidence you have in listening to your own body and following those internal cues enabling you to increase how often you eat when you are hungry  not out of appetite and stop when you are satisfied not full.  Encouraged pt to continue to eat balanced meals inclusive of non starchy vegetables 2 times a day 7 days a week Encouraged pt to choose lean protein sources: limiting beef, pork, sausage, hotdogs, and lunch meat Encourage pt to choose healthy fats such as plant based limiting animal fats Encouraged pt to continue to drink a minium 64 fluid ounces with half being plain water to satisfy proper hydration   Handouts Previously Provided Include  Bariatric MyPlate  Learning Style & Readiness for Change Teaching method utilized: Visual & Auditory  Demonstrated degree of understanding via: Teach Back  Readiness Level: action Barriers to learning/adherence to lifestyle change: none identified at this time  RD's Notes for Next Visit Assess adherence to pt chosen goals   MONITORING & EVALUATION Dietary intake, weekly physical activity, body weight  Next Steps Patient is to follow-up as needed

## 2021-06-22 ENCOUNTER — Other Ambulatory Visit: Payer: Self-pay | Admitting: Cardiology

## 2021-08-06 ENCOUNTER — Other Ambulatory Visit: Payer: Self-pay | Admitting: *Deleted

## 2021-08-06 ENCOUNTER — Telehealth: Payer: Self-pay | Admitting: Allergy

## 2021-08-06 MED ORDER — RYVENT 6 MG PO TABS
1.0000 | ORAL_TABLET | Freq: Two times a day (BID) | ORAL | 5 refills | Status: DC
Start: 2021-08-06 — End: 2022-11-20

## 2021-08-06 NOTE — Telephone Encounter (Signed)
Patient called and said that the pharmacy said they did not have the Ryvent on file and needs to have it called in [phone]

## 2021-08-06 NOTE — Telephone Encounter (Signed)
Prescription has been sent in. Called patient and left a voicemail asking for a return call to inform.

## 2021-08-07 NOTE — Telephone Encounter (Signed)
Called and left a voicemail asking for patient to return call to inform.  

## 2021-11-06 ENCOUNTER — Encounter: Payer: Self-pay | Admitting: Allergy

## 2021-11-06 ENCOUNTER — Other Ambulatory Visit: Payer: Self-pay

## 2021-11-06 ENCOUNTER — Ambulatory Visit (INDEPENDENT_AMBULATORY_CARE_PROVIDER_SITE_OTHER): Payer: BC Managed Care – PPO | Admitting: Allergy

## 2021-11-06 VITALS — BP 122/82 | HR 70 | Temp 98.3°F | Resp 16 | Ht 67.0 in | Wt 285.6 lb

## 2021-11-06 DIAGNOSIS — J3089 Other allergic rhinitis: Secondary | ICD-10-CM | POA: Diagnosis not present

## 2021-11-06 MED ORDER — CARBINOXAMINE MALEATE 4 MG PO TABS: 2.0000 | ORAL_TABLET | Freq: Two times a day (BID) | ORAL | 5 refills | Status: DC | PRN

## 2021-11-06 NOTE — Patient Instructions (Addendum)
Allergic rhinitis - continue avoidance measures for grasses, weeds, trees, molds, cat, cockroach.  - use nasal antihistamine, Astelin (or OTC Astepro) 2 sprays each nostril twice a day as needed for nasal drainage control.    - use antihistamine Carboxamine (formerly Ryvent) 4mg  1-2 tabs twice a day as needed for allergy symptom control. - if allergy medication regimen is not effective enough consider course of allergen immunotherapy  Follow-up in 12 months or sooner if needed

## 2021-11-06 NOTE — Progress Notes (Signed)
° ° °  Follow-up Note  RE: Cheryl Morrow MRN: [id number] DOB: Feb 18, 1971 Date of Office Visit: 11/06/2021   History of present illness: Cheryl Morrow is a 51 y.o. female presenting today for follow-up of allergic rhinitis.  She was last seen in the office on 10/31/20 by myself.  She has not had any major health changes, surgeries or hospitalizations since her last visit.  She states her Ryvent per the pharmacy she gets it from had a shortage and she was unable to get this medication about 3 months ago.  Thus she has been off for the past 3 months.  In this timeframe she has not noted any increase in allergy symptoms.  Thus she denies currently ocular, nasal or generalized allergy symptoms.  She also has not needed to use her Astelin nasal spray either.  Review of systems: Review of Systems  Constitutional: Negative.   HENT: Negative.    Eyes: Negative.   Respiratory: Negative.    Cardiovascular: Negative.   Gastrointestinal: Negative.   Musculoskeletal: Negative.   Skin: Negative.   Allergic/Immunologic: Negative.   Neurological: Negative.     All other systems negative unless noted above in HPI  Past medical/social/surgical/family history have been reviewed and are unchanged unless specifically indicated below.  No changes  Medication List: Current Outpatient Medications  Medication Sig Dispense Refill   Carbinoxamine Maleate 4 MG TABS Take 2 tablets (8 mg total) by mouth 2 (two) times daily as needed. 120 tablet 5   losartan-hydrochlorothiazide (HYZAAR) 50-12.5 MG tablet TAKE 1 TABLET BY MOUTH IN THE MORNING AND IN THE EVENING 180 tablet 3   Multiple Vitamin (MULTIVITAMIN WITH MINERALS) TABS tablet Take 1 tablet by mouth daily.     RYVENT 6 MG TABS Take 1 tablet by mouth 2 (two) times daily. 60 tablet 5   No current facility-administered medications for this visit.     Known medication allergies: Allergies  Allergen Reactions   Lisinopril Cough     Physical  examination: Blood pressure 122/82, pulse 70, temperature 98.3 F (36.8 C), temperature source Temporal, resp. rate 16, height 5\' 7"  (1.702 m), weight 285 lb 9.6 oz (129.5 kg), SpO2 100 %.  General: Alert, interactive, in no acute distress. HEENT: PERRLA, TMs pearly gray, turbinates non-edematous without discharge, post-pharynx non erythematous. Neck: Supple without lymphadenopathy. Lungs: Clear to auscultation without wheezing, rhonchi or rales. {no increased work of breathing. CV: Normal S1, S2 without murmurs. Abdomen: Nondistended, nontender. Skin: Warm and dry, without lesions or rashes. Extremities:  No clubbing, cyanosis or edema. Neuro:   Grossly intact.  Diagnositics/Labs: Labs today  Assessment and plan: Allergic rhinitis - continue avoidance measures for grasses, weeds, trees, molds, cat, cockroach.  - use nasal antihistamine, Astelin (or OTC Astepro) 2 sprays each nostril twice a day as needed for nasal drainage control.    - use antihistamine Carboxamine (formerly Ryvent) 4mg  1-2 tabs twice a day as needed for allergy symptom control. - if allergy medication regimen is not effective enough consider course of allergen immunotherapy  Follow-up in 12 months or sooner if needed  I appreciate the opportunity to take part in [place] care. Please do not hesitate to contact me with questions.  Sincerely,   Prudy Feeler, MD Allergy/Immunology [facility]

## 2022-01-27 ENCOUNTER — Telehealth: Payer: Self-pay

## 2022-01-27 NOTE — Telephone Encounter (Signed)
Per pharmacy, insurance does not cover Losartan HCTZ. Alternatives covered by plan is Valsartan HCTZ, Olmesartan HCTZ, Ibesartan HCTZ, Telmisartan HCTZ and Candesartan HCTZ tablets. LM for the patient to return my call for details.  ?

## 2022-03-20 ENCOUNTER — Telehealth: Payer: Self-pay | Admitting: Cardiology

## 2022-03-20 NOTE — Telephone Encounter (Signed)
 *  STAT* If patient is at the pharmacy, call can be transferred to refill team.   1. Which medications need to be refilled? (please list name of each medication and dose if known) losartan-hydrochlorothiazide (HYZAAR) 50-12.5 MG tablet  2. Which pharmacy/location (including street and city if local pharmacy) is medication to be sent to? [facility] [place], [place] to Registered [facility]  3. Do they need a 30 day or 90 day supply? 90 days

## 2022-03-23 MED ORDER — LOSARTAN POTASSIUM-HCTZ 50-12.5 MG PO TABS: ORAL_TABLET | ORAL | 0 refills | Status: AC

## 2022-03-23 NOTE — Telephone Encounter (Signed)
Rx refill sent to pharmacy. 

## 2022-04-16 ENCOUNTER — Encounter (HOSPITAL_COMMUNITY): Payer: Self-pay | Admitting: *Deleted

## 2022-05-11 ENCOUNTER — Ambulatory Visit (INDEPENDENT_AMBULATORY_CARE_PROVIDER_SITE_OTHER): Payer: BC Managed Care – PPO | Admitting: Cardiology

## 2022-05-11 ENCOUNTER — Encounter: Payer: Self-pay | Admitting: Cardiology

## 2022-05-11 VITALS — BP 130/90 | HR 63 | Ht 67.0 in | Wt 289.0 lb

## 2022-05-11 DIAGNOSIS — I517 Cardiomegaly: Secondary | ICD-10-CM

## 2022-05-11 DIAGNOSIS — I10 Essential (primary) hypertension: Secondary | ICD-10-CM | POA: Diagnosis not present

## 2022-05-11 DIAGNOSIS — R7303 Prediabetes: Secondary | ICD-10-CM | POA: Diagnosis not present

## 2022-05-11 DIAGNOSIS — E7800 Pure hypercholesterolemia, unspecified: Secondary | ICD-10-CM

## 2022-05-11 NOTE — Progress Notes (Unsigned)
Cardiology Office Note:    Date:  05/11/2022   ID:  Cheryl Morrow, DOB Feb 28, 1971, MRN [id number]  PCP:  [place]  Cardiologist:  Jenne Campus, MD    Referring MD: [facility]   Chief Complaint  Patient presents with   Follow-up  Doing very well  History of Present Illness:    Cheryl Morrow is a 51 y.o. female with past medical history significant for essential hypertension, morbid obesity, status post gastric sleeve surgery, comes today to my office for follow-up.  Overall doing very well.  She lost 100 pounds.  Feeling dramatically better looks good feels well able to walk climb stairs blood pressure is better.  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   Sickle cell trait ([place])    Uncontrolled hypertension 09/15/2019   Uterine leiomyoma 12/02/2015    Past Surgical History:  Procedure Laterality Date   ADENOIDECTOMY     HYSTERECTOMY ABDOMINAL WITH SALPINGECTOMY     LAPAROSCOPIC GASTRIC SLEEVE RESECTION  09/23/2020   SINOSCOPY     TUBAL LIGATION     TYMPANOSTOMY TUBE PLACEMENT     UPPER GI ENDOSCOPY N/A 09/23/2020   Procedure: UPPER GI ENDOSCOPY;  Surgeon: Greer Pickerel, MD;  Location: [facility];  Service: General;  Laterality: N/A;    Current Medications: Current Meds  Medication Sig   Carbinoxamine Maleate 4 MG TABS Take 2 tablets (8 mg total) by mouth 2  (two) times daily as needed. (Patient taking differently: Take 2 tablets by mouth 2 (two) times daily.)   losartan-hydrochlorothiazide (HYZAAR) 50-12.5 MG tablet TAKE 1 TABLET BY MOUTH IN THE MORNING AND IN THE EVENING (Patient taking differently: Take 1 tablet by mouth 2 (two) times daily. TAKE 1 TABLET BY MOUTH IN THE MORNING AND IN THE EVENING)   Multiple Vitamin (MULTIVITAMIN WITH MINERALS) TABS tablet Take 1 tablet by mouth daily.   RYVENT 6 MG TABS Take 1 tablet by mouth 2 (two) times daily.     Allergies:   Lisinopril   Social History   Socioeconomic History   Marital status: Single    Spouse name: Not on file   Number of children: Not on file   Years of education: Not on file   Highest education level: Not on file  Occupational History   Not on file  Tobacco Use   Smoking status: Never   Smokeless tobacco: Never  Vaping Use   Vaping Use: Never used  Substance and Sexual Activity   Alcohol use: Yes    Comment: occasionally   Drug use: Never   Sexual activity: Yes  Other Topics Concern   Not on file  Social History Narrative   Not on file   Social Determinants of Health   Financial Resource Strain: Not on file  Food Insecurity: Not on file  Transportation Needs: Not on file  Physical Activity: Not on file  Stress: Not on file  Social Connections: Not on file     Family History: The patient's family history includes Arthritis in her mother; Colon cancer in her father; Hypertension in her father, mother, and sister; Other in her brother; Sickle cell trait in her brother. There is no history of Allergic rhinitis, Angioedema, Asthma, Eczema, Atopy, Immunodeficiency, or Urticaria. ROS:   Please see the history of present illness.    All 14 point review of systems negative except as described per history of present illness  EKGs/Labs/Other Studies Reviewed:      Recent Labs: No results found for requested labs within last 365 days.  Recent Lipid Panel     Component Value Date/Time   CHOL 179 04/21/2021 0854   TRIG 92 04/21/2021 0854   HDL 52 04/21/2021 0854   CHOLHDL 3.4 04/21/2021 0854   LDLCALC 110 (H) 04/21/2021 0854    Physical Exam:    VS:  BP 130/90 (BP Location: Left Arm, Patient Position: Sitting)   Pulse 63   Ht '5\' 7"'$  (1.702 m)   Wt 289 lb (131.1 kg)   SpO2 97%   BMI 45.26 kg/m     Wt Readings from Last 3 Encounters:  05/11/22 289 lb (131.1 kg)  11/06/21 285 lb 9.6 oz (129.5 kg)  05/12/21 292 lb 11.2 oz (132.8 kg)     GEN:  Well nourished, well developed in no acute distress HEENT: Normal NECK: No JVD; No carotid bruits LYMPHATICS: No lymphadenopathy CARDIAC: RRR, no murmurs, no rubs, no gallops RESPIRATORY:  Clear to auscultation without rales, wheezing or rhonchi  ABDOMEN: Soft, non-tender, non-distended MUSCULOSKELETAL:  No edema; No deformity  SKIN: Warm and dry LOWER EXTREMITIES: no swelling NEUROLOGIC:  Alert and oriented x 3 PSYCHIATRIC:  Normal affect   ASSESSMENT:    1. Mild cardiomegaly   2. Essential hypertension   3. Morbid obesity ([place])   4. Prediabetes   5. Elevated LDL cholesterol level    PLAN:    In order of problems listed above:  Mild cardiomegaly echocardiogram showed normal size of the heart. Essential hypertension, she has been actually today about potentially stopping some of the medication which I recommended not to do her blood pressure finally is nicely controlled and I will continue present management. Morbid obesity she lost significant amount of weight she weighed 289 pounds.  We will continue watching her diet as well as continue exercises Prediabetes that being followed by internal medicine team, apparently stable, her last hemoglobin A1c is 5.4 this is from K PN Dyslipidemia we will check her fasting to be drawn following reviewing of K PN show LDL of 1-10 HDL 52 this is before her surgery I anticipate that is better now   Medication Adjustments/Labs and Tests  Ordered: Current medicines are reviewed at length with the patient today.  Concerns regarding medicines are outlined above.  Orders Placed This Encounter  Procedures   EKG 12-Lead   Medication changes: No orders of the defined types were placed in this encounter.   Signed, Park Liter, MD, [facility] 05/11/2022 4:19 PM    [place]

## 2022-05-11 NOTE — Patient Instructions (Addendum)
Medication Instructions:  Your physician recommends that you continue on your current medications as directed. Please refer to the Current Medication list given to you today.  *If you need a refill on your cardiac medications before your next appointment, please call your pharmacy*   Lab Work: 2nd [place] [address] Fasting Lipid Panel- today If you have labs (blood work) drawn today and your tests are completely normal, you will receive your results only by: MyChart Message (if you have MyChart) OR A paper copy in the mail If you have any lab test that is abnormal or we need to change your treatment, we will call you to review the results.   Testing/Procedures: None Ordered   Follow-Up: At [facility], you and your health needs are our priority.  As part of our continuing mission to provide you with exceptional heart care, we have created designated Provider Care Teams.  These Care Teams include your primary Cardiologist (physician) and Advanced Practice Providers (APPs -  Physician Assistants and Nurse Practitioners) who all work together to provide you with the care you need, when you need it.  We recommend signing up for the patient portal called "MyChart".  Sign up information is provided on this After Visit Summary.  MyChart is used to connect with patients for Virtual Visits (Telemedicine).  Patients are able to view lab/test results, encounter notes, upcoming appointments, etc.  Non-urgent messages can be sent to your provider as well.   To learn more about what you can do with MyChart, go to [email or web address].    Your next appointment:  AS NEEDED  The format for your next appointment:   In Person  Provider:   Jenne Campus, MD    Other Instructions NA

## 2022-05-12 LAB — LIPID PANEL
Chol/HDL Ratio: 3.2 ratio (ref 0.0–4.4)
Cholesterol, Total: 207 mg/dL — ABNORMAL HIGH (ref 100–199)
HDL: 64 mg/dL (ref >39)
LDL Chol Calc (NIH): 124 mg/dL — ABNORMAL HIGH (ref 0–99)
Triglycerides: 106 mg/dL (ref 0–149)
VLDL Cholesterol Cal: 19 mg/dL (ref 5–40)

## 2022-05-28 ENCOUNTER — Telehealth: Payer: Self-pay | Admitting: Allergy

## 2022-05-28 NOTE — Telephone Encounter (Signed)
Pt is requesting a refill for carbinoxamine maleate. Patient is requesting this go to [place].

## 2022-05-28 NOTE — Telephone Encounter (Signed)
Called Patient and no answer left a voicemail to callback to the [facility] office.

## 2022-06-02 NOTE — Telephone Encounter (Signed)
Called and left a message informing patient that refills have been sent to the requested pharmacy.

## 2022-11-06 ENCOUNTER — Ambulatory Visit: Payer: BC Managed Care – PPO | Admitting: Allergy

## 2022-11-20 ENCOUNTER — Encounter: Payer: Self-pay | Admitting: Allergy

## 2022-11-20 ENCOUNTER — Ambulatory Visit (INDEPENDENT_AMBULATORY_CARE_PROVIDER_SITE_OTHER): Payer: BC Managed Care – PPO | Admitting: Allergy

## 2022-11-20 ENCOUNTER — Other Ambulatory Visit: Payer: Self-pay

## 2022-11-20 VITALS — BP 118/80 | HR 68 | Temp 97.5°F | Resp 18 | Ht 67.0 in | Wt 290.7 lb

## 2022-11-20 DIAGNOSIS — J3089 Other allergic rhinitis: Secondary | ICD-10-CM | POA: Diagnosis not present

## 2022-11-20 MED ORDER — RYVENT 6 MG PO TABS: 1.0000 | ORAL_TABLET | Freq: Two times a day (BID) | ORAL | 1 refills | Status: AC

## 2022-11-20 MED ORDER — RYALTRIS 665-25 MCG/ACT NA SUSP: NASAL | 5 refills | Status: AC

## 2022-11-20 NOTE — Patient Instructions (Addendum)
Allergic rhinitis - continue avoidance measures for grasses, weeds, trees, molds, cat, cockroach.  - for nasal congestion or drainage try use of nasal spray Ryaltris 2 sprays each nostril twice a day as needed.  This is a combination spray with mometasone (steroid for nasal congestion control) and olopatadine (antihistamine for nasal drainage control).  Sample provided today.   - if needing over-the-counter options you can get -  Astepro (nasal antihistamine) 2 sprays each nostril twice a day as needed for nasal drainage control AND Nasacort/Rhinocort/Flonase 2 sprays each nostril daily for nasal congestion  - use antihistamine Ryvent '6mg'$  1 twice a day as needed for allergy symptom control. - if allergy medication regimen is not effective enough consider course of allergen immunotherapy  Follow-up in 12 months or sooner if needed

## 2022-11-20 NOTE — Progress Notes (Signed)
Follow-up Note  RE: Cheryl Morrow MRN: [id number] DOB: [birth year] Date of Office Visit: 11/20/2022   History of present illness: Cheryl Morrow is a 52 y.o. female presenting today for follow-up of allergic rhinitis.  She was last seen in the office on 11/06/2021 by myself.  She has had a good year.  She denies any major health changes, surgeries or hospitalizations since the last visit.  She states her allergies have done quite well over the past year.  She does note branded Ryvent does seem to be more effective than the generic carbinoxamine.  She would like to have the RyVent sent back in for use.  She states she has not needed to use the nasal spray which was recommended Astelin.  She is hopeful that this next pollen season will continue to be okay in regards to her symptom control. She has had both COVID and flu vaccines this season.  She has not had any upper respiratory illnesses this season.  Review of systems: Review of Systems  Constitutional: Negative.   HENT: Negative.    Eyes: Negative.   Respiratory: Negative.    Cardiovascular: Negative.   Gastrointestinal: Negative.   Musculoskeletal: Negative.   Skin: Negative.   Allergic/Immunologic: Negative.   Neurological: Negative.      All other systems negative unless noted above in HPI  Past medical/social/surgical/family history have been reviewed and are unchanged unless specifically indicated below.  No changes  Medication List: Current Outpatient Medications  Medication Sig Dispense Refill   Carbinoxamine Maleate 4 MG TABS Take 2 tablets (8 mg total) by mouth 2 (two) times daily as needed. (Patient taking differently: Take 2 tablets by mouth 2 (two) times daily.) 120 tablet 5   losartan-hydrochlorothiazide (HYZAAR) 50-12.5 MG tablet TAKE 1 TABLET BY MOUTH IN THE MORNING AND IN THE EVENING (Patient taking differently: Take 1 tablet by mouth 2 (two) times daily. TAKE 1 TABLET BY MOUTH IN THE MORNING AND IN THE EVENING)  180 tablet 0   Multiple Vitamin (MULTIVITAMIN WITH MINERALS) TABS tablet Take 1 tablet by mouth daily.     RYALTRIS [id number] MCG/ACT SUSP 2 sprays each nostril 2 times daily as needed for congestion or drainage. 29 g 5   RYVENT 6 MG TABS Take 1 tablet (6 mg total) by mouth 2 (two) times daily. 180 tablet 1   No current facility-administered medications for this visit.     Known medication allergies: Allergies  Allergen Reactions   Lisinopril Cough     Physical examination: Blood pressure 118/80, pulse 68, temperature (!) 97.5 F (36.4 C), temperature source Temporal, resp. rate 18, height '5\' 7"'$  (1.702 m), weight 290 lb 11.2 oz (131.9 kg), SpO2 98 %.  General: Alert, interactive, in no acute distress. HEENT: PERRLA, TMs pearly gray, turbinates non-edematous without discharge, post-pharynx non erythematous. Neck: Supple without lymphadenopathy. Lungs: Clear to auscultation without wheezing, rhonchi or rales. {no increased work of breathing. CV: Normal S1, S2 without murmurs. Abdomen: Nondistended, nontender. Skin: Warm and dry, without lesions or rashes. Extremities:  No clubbing, cyanosis or edema. Neuro:   Grossly intact.  Diagnositics/Labs: None today  Assessment and plan: Allergic rhinitis - continue avoidance measures for grasses, weeds, trees, molds, cat, cockroach.  - for nasal congestion or drainage try use of nasal spray Ryaltris 2 sprays each nostril twice a day as needed.  This is a combination spray with mometasone (steroid for nasal congestion control) and olopatadine (antihistamine for nasal drainage control).  Sample provided today.   -  if needing over-the-counter options you can get -  Astepro (nasal antihistamine) 2 sprays each nostril twice a day as needed for nasal drainage control AND Nasacort/Rhinocort/Flonase 2 sprays each nostril daily for nasal congestion  - use antihistamine Ryvent '6mg'$  1 twice a day as needed for allergy symptom control. - if allergy  medication regimen is not effective enough consider course of allergen immunotherapy  Follow-up in 12 months or sooner if needed  I appreciate the opportunity to take part in [place] care. Please do not hesitate to contact me with questions.  Sincerely,   Prudy Feeler, MD Allergy/Immunology [facility]

## 2022-11-25 ENCOUNTER — Telehealth: Payer: Self-pay | Admitting: Allergy

## 2022-11-25 ENCOUNTER — Other Ambulatory Visit: Payer: Self-pay | Admitting: *Deleted

## 2022-11-25 MED ORDER — CARBINOXAMINE MALEATE 4 MG PO TABS: 2.0000 | ORAL_TABLET | Freq: Two times a day (BID) | ORAL | 1 refills | Status: AC | PRN

## 2022-11-25 NOTE — Telephone Encounter (Signed)
Patient called and said that the ryvent 6 mg tabs. Was not sent in but Ryaltris nasal spray was she dont needed that.or you can send in Carbinoxamine . [place]. [phone].

## 2022-11-25 NOTE — Telephone Encounter (Signed)
I called and spoke with the patient, there was some confusion about receiving the prescription for the Ryaltris nasal spray but not the Ryvent because it is not covered. The generic Carbinoxamine is covered, I sent that in to [place] and advised the patient. Patient verbalized understanding.

## 2022-12-01 ENCOUNTER — Telehealth: Payer: Self-pay | Admitting: Allergy

## 2022-12-01 NOTE — Telephone Encounter (Signed)
Patient called and said that the nose spray was the only that called into her pharmacy and she dont want it. She need the Ryvent and carbinoxamine called [facility]. [phone]

## 2022-12-02 NOTE — Telephone Encounter (Signed)
Left message for patient to call back to see if she received the Carbinoxamine from the pharmacy.  Cheryl Morrow [phone]

## 2022-12-02 NOTE — Telephone Encounter (Signed)
Patient called back and states she did not receive the carbinoxamine from the pharmacy and she does not think it was sent in.   Advised patient it was sent into [facility] on 11-25-2022. Advised patient to call [facility] to see if they have the prescription & why it has not been shipped to her.   Patient verbalized understanding and stated she would do so.

## 2023-11-19 ENCOUNTER — Ambulatory Visit: Payer: BC Managed Care – PPO | Admitting: Allergy

## 2024-04-28 ENCOUNTER — Encounter (HOSPITAL_COMMUNITY): Payer: Self-pay | Admitting: *Deleted
